# Patient Record
Sex: Male | Born: 1985 | Race: White | Hispanic: No | Marital: Single | Smoking: Former smoker
Health system: Southern US, Community
[De-identification: ages and names within clinical notes are randomized; demographics above are authoritative.]

## PROBLEM LIST (undated history)

## (undated) DIAGNOSIS — M419 Scoliosis, unspecified: Secondary | ICD-10-CM

## (undated) DIAGNOSIS — F419 Anxiety disorder, unspecified: Secondary | ICD-10-CM

## (undated) DIAGNOSIS — R12 Heartburn: Secondary | ICD-10-CM

## (undated) HISTORY — DX: Heartburn: R12

## (undated) HISTORY — DX: Anxiety disorder, unspecified: F41.9

## (undated) HISTORY — DX: Scoliosis, unspecified: M41.9

---

## 2006-01-07 ENCOUNTER — Ambulatory Visit: Payer: Self-pay | Admitting: Family Medicine

## 2007-06-22 ENCOUNTER — Encounter: Payer: Self-pay | Admitting: Endocrinology

## 2007-06-22 ENCOUNTER — Ambulatory Visit: Payer: Self-pay | Admitting: Endocrinology

## 2007-06-22 LAB — CONVERTED CEMR LAB
FSH: 9 milliintl units/mL
LH: 2.8 milliintl units/mL

## 2008-12-24 ENCOUNTER — Emergency Department (HOSPITAL_COMMUNITY): Admission: EM | Admit: 2008-12-24 | Discharge: 2008-12-24 | Payer: Self-pay | Admitting: Emergency Medicine

## 2011-01-17 LAB — RAPID URINE DRUG SCREEN, HOSP PERFORMED
Barbiturates: NOT DETECTED
Benzodiazepines: NOT DETECTED
Cocaine: NOT DETECTED
Opiates: NOT DETECTED

## 2011-01-17 LAB — URINALYSIS, ROUTINE W REFLEX MICROSCOPIC
Glucose, UA: NEGATIVE mg/dL
Nitrite: NEGATIVE
Specific Gravity, Urine: 1.005 (ref 1.005–1.030)
pH: 6 (ref 5.0–8.0)

## 2011-01-17 LAB — COMPREHENSIVE METABOLIC PANEL
AST: 22 U/L (ref 0–37)
BUN: 10 mg/dL (ref 6–23)
CO2: 23 mEq/L (ref 19–32)
Calcium: 8.7 mg/dL (ref 8.4–10.5)
Creatinine, Ser: 0.95 mg/dL (ref 0.4–1.5)
GFR calc Af Amer: >60 mL/min (ref >60)
GFR calc non Af Amer: >60 mL/min (ref >60)
Total Bilirubin: 1.2 mg/dL (ref 0.3–1.2)

## 2011-01-17 LAB — DIFFERENTIAL
Basophils Absolute: 0 10*3/uL (ref 0.0–0.1)
Eosinophils Relative: 1 % (ref 0–5)
Lymphocytes Relative: 23 % (ref 12–46)
Lymphs Abs: 2.2 10*3/uL (ref 0.7–4.0)
Neutro Abs: 6.8 10*3/uL (ref 1.7–7.7)
Neutrophils Relative %: 69 % (ref 43–77)

## 2011-01-17 LAB — CBC
HCT: 44.1 % (ref 39.0–52.0)
MCHC: 34.2 g/dL (ref 30.0–36.0)
MCV: 93.5 fL (ref 78.0–100.0)
RBC: 4.71 MIL/uL (ref 4.22–5.81)

## 2017-03-05 ENCOUNTER — Telehealth: Payer: Self-pay | Admitting: Family Medicine

## 2017-03-05 NOTE — Telephone Encounter (Signed)
Patient would like to reestablish care with you.  He was last seen 05/12/2013.  He has [employer] for insurance.  He has not seen another PCP.  He just has not needed to be seen.  He needs to be seen for cough that has been happening for a couple of months.  Mostly in the morning for about an hour.  He is also having trouble eating.  Says he wakes up hungry but when he tries to eat he just can't.  Says he doesn't really have trouble swallow.  He is not on any medications.  If you are not available to see him he is willing to see another provider.

## 2017-03-05 NOTE — Telephone Encounter (Signed)
That's fine

## 2017-03-06 NOTE — Telephone Encounter (Signed)
Appointment scheduled for 03/10/17@4 /MW

## 2017-03-10 ENCOUNTER — Ambulatory Visit (INDEPENDENT_AMBULATORY_CARE_PROVIDER_SITE_OTHER): Payer: BLUE CROSS/BLUE SHIELD | Admitting: Family Medicine

## 2017-03-10 ENCOUNTER — Encounter: Payer: Self-pay | Admitting: Family Medicine

## 2017-03-10 VITALS — BP 100/73 | HR 73 | Temp 98.1°F | Resp 16 | Ht 64.0 in | Wt 129.0 lb

## 2017-03-10 DIAGNOSIS — R6881 Early satiety: Secondary | ICD-10-CM

## 2017-03-10 NOTE — Progress Notes (Signed)
       Patient: Eddie Olson Male    DOB: [birth year]   31 y.o.   MRN: [id number] Visit Date: 03/10/2017  Today's Provider: Mila Merryonald Laparis Durrett, MD   Chief Complaint  Patient presents with  . Establish Care   Subjective:    HPI Patient is here to reestabish care. He was last seen in 2014. At that time was on sertraline for SAD which he was able to stop a few years ago. He is here today for evaluation of early satiety. He states he has always had poor appetite and he usually feels full after just a few bites of a meal. Is not nauseated and has no stomach pains, but just fills up very quickly No change in bowel movements. No recent weight loss.  No Known Allergies  No current outpatient prescriptions on file.  Review of Systems  Constitutional: Positive for appetite change.  HENT: Positive for mouth sores.   Eyes: Negative.   Respiratory: Positive for cough and shortness of breath.   Cardiovascular: Negative.   Gastrointestinal: Negative.   Endocrine: Negative.   Genitourinary: Negative.   Musculoskeletal: Negative.   Skin: Negative.   Allergic/Immunologic: Negative.   Neurological: Negative.   Hematological: Negative.   Psychiatric/Behavioral: Negative.     Social History  Substance Use Topics  . Smoking status: Light Tobacco Smoker    Years: 5.00  . Smokeless tobacco: Never Used     Comment: Hookah   . Alcohol use No   Objective:   BP 100/73 (BP Location: Right Arm, Patient Position: Sitting, Cuff Size: Normal)   Pulse 73   Temp 98.1 F (36.7 C) (Oral)   Resp 16   Ht 5\' 4"  (1.626 m)   Wt 129 lb (58.5 kg)   SpO2 98%   BMI 22.14 kg/m  Vitals:   03/10/17 1617  BP: 100/73  Pulse: 73  Resp: 16  Temp: 98.1 F (36.7 C)  TempSrc: Oral  SpO2: 98%  Weight: 129 lb (58.5 kg)  Height: 5\' 4"  (1.626 m)   Depression screen [facility] 03/10/2017  Decreased Interest 0  Down, Depressed, Hopeless 1  PHQ - 2 Score 1  Altered sleeping 0  Tired, decreased energy 1  Change in  appetite 3  Feeling bad or failure about yourself  2  Trouble concentrating 3  Moving slowly or fidgety/restless 0  Suicidal thoughts 0  PHQ-9 Score 10  Difficult doing work/chores Not difficult at all      Physical Exam  General Appearance:    Alert, cooperative, no distress  Eyes:    PERRL, conjunctiva/corneas clear, EOM's intact       Lungs:     Clear to auscultation bilaterally, respirations unlabored  Heart:    Regular rate and rhythm  Abdomen:   . No CVA tenderness. Soft non-tender, non-distended. No masses. No bruits.         Assessment & Plan:     1. Early satiety  - Comprehensive metabolic panel - CBC - Lipid panel - T4 AND TSH  Is clearly not malnourished. Advised if labs are normal we can consider upper GI to rule out structural stomach abnormality.       Mila Merryonald Dasean Brow, MD  [facility]

## 2017-03-11 ENCOUNTER — Telehealth: Payer: Self-pay

## 2017-03-11 DIAGNOSIS — R6881 Early satiety: Secondary | ICD-10-CM

## 2017-03-11 LAB — CBC
HEMATOCRIT: 42.9 % (ref 37.5–51.0)
HEMOGLOBIN: 15.1 g/dL (ref 13.0–17.7)
MCH: 31.5 pg (ref 26.6–33.0)
MCHC: 35.2 g/dL (ref 31.5–35.7)
MCV: 89 fL (ref 79–97)
Platelets: 216 10*3/uL (ref 150–379)
RBC: 4.8 x10E6/uL (ref 4.14–5.80)
RDW: 12.6 % (ref 12.3–15.4)
WBC: 8.5 10*3/uL (ref 3.4–10.8)

## 2017-03-11 LAB — LIPID PANEL
CHOL/HDL RATIO: 2.5 ratio (ref 0.0–5.0)
Cholesterol, Total: 132 mg/dL (ref 100–199)
HDL: 53 mg/dL (ref >39)
LDL CALC: 58 mg/dL (ref 0–99)
Triglycerides: 105 mg/dL (ref 0–149)
VLDL Cholesterol Cal: 21 mg/dL (ref 5–40)

## 2017-03-11 LAB — COMPREHENSIVE METABOLIC PANEL
ALBUMIN: 4.7 g/dL (ref 3.5–5.5)
ALT: 15 IU/L (ref 0–44)
AST: 14 IU/L (ref 0–40)
Albumin/Globulin Ratio: 2 (ref 1.2–2.2)
Alkaline Phosphatase: 67 IU/L (ref 39–117)
BILIRUBIN TOTAL: 0.5 mg/dL (ref 0.0–1.2)
BUN / CREAT RATIO: 8 — AB (ref 9–20)
BUN: 9 mg/dL (ref 6–20)
CALCIUM: 9.8 mg/dL (ref 8.7–10.2)
CHLORIDE: 105 mmol/L (ref 96–106)
CO2: 24 mmol/L (ref 18–29)
CREATININE: 1.11 mg/dL (ref 0.76–1.27)
GFR, EST AFRICAN AMERICAN: 103 mL/min/{1.73_m2} (ref >59)
GFR, EST NON AFRICAN AMERICAN: 89 mL/min/{1.73_m2} (ref >59)
GLUCOSE: 83 mg/dL (ref 65–99)
Globulin, Total: 2.3 g/dL (ref 1.5–4.5)
Potassium: 4.6 mmol/L (ref 3.5–5.2)
Sodium: 143 mmol/L (ref 134–144)
TOTAL PROTEIN: 7 g/dL (ref 6.0–8.5)

## 2017-03-11 LAB — T4 AND TSH
T4, Total: 7.4 ug/dL (ref 4.5–12.0)
TSH: 1.92 u[IU]/mL (ref 0.450–4.500)

## 2017-03-11 NOTE — Telephone Encounter (Signed)
-----   Message from Malva Limesonald E Fisher, MD sent at 03/11/2017  7:45 AM EDT ----- Labs normal. Recommend upper GI with small bowel follow through for early satiety.

## 2017-03-11 NOTE — Telephone Encounter (Signed)
Pt informed. He is agreeable to the test. Are you referring him to GI or ordering the test?

## 2017-03-11 NOTE — Telephone Encounter (Signed)
Left message to call back  

## 2017-03-12 DIAGNOSIS — R6881 Early satiety: Secondary | ICD-10-CM | POA: Insufficient documentation

## 2017-03-21 ENCOUNTER — Other Ambulatory Visit: Payer: Self-pay

## 2017-03-25 ENCOUNTER — Ambulatory Visit
Admission: RE | Admit: 2017-03-25 | Discharge: 2017-03-25 | Disposition: A | Discharge status: Home / Self Care | Payer: BLUE CROSS/BLUE SHIELD | Source: Ambulatory Visit | Attending: Family Medicine | Admitting: Family Medicine

## 2017-03-25 ENCOUNTER — Telehealth: Payer: Self-pay

## 2017-03-25 DIAGNOSIS — R6881 Early satiety: Secondary | ICD-10-CM | POA: Diagnosis present

## 2017-03-25 DIAGNOSIS — K219 Gastro-esophageal reflux disease without esophagitis: Secondary | ICD-10-CM | POA: Insufficient documentation

## 2017-03-25 DIAGNOSIS — K449 Diaphragmatic hernia without obstruction or gangrene: Secondary | ICD-10-CM | POA: Insufficient documentation

## 2017-03-25 NOTE — Telephone Encounter (Signed)
-----   Message from Malva Limesonald E Fisher, MD sent at 03/25/2017 12:51 PM EDT ----- Upper gi shows severe acid reflux which may be causing his gi symptoms. Need to start pantoprazole 40mg  once a day, #30, rf x 1 and schedule follow up in a month. Need to eat small frequent meals (try 5 small meals a day)

## 2017-03-25 NOTE — Telephone Encounter (Signed)
Left message to call back  

## 2017-03-26 MED ORDER — PANTOPRAZOLE SODIUM 40 MG PO TBEC: 40.0000 mg | DELAYED_RELEASE_TABLET | Freq: Every day | ORAL | 1 refills | Status: DC

## 2017-03-26 NOTE — Telephone Encounter (Signed)
Pt returned called

## 2017-03-26 NOTE — Telephone Encounter (Signed)
Patient was notified of results. Expressed understanding. Rx sent to pharmacy. 

## 2017-04-21 ENCOUNTER — Other Ambulatory Visit: Payer: Self-pay | Admitting: Family Medicine

## 2017-04-21 MED ORDER — PANTOPRAZOLE SODIUM 40 MG PO TBEC: 40.0000 mg | DELAYED_RELEASE_TABLET | Freq: Every day | ORAL | 4 refills | Status: DC

## 2017-04-21 NOTE — Telephone Encounter (Signed)
[facility] pharmacy faxed a request for a 90-days supply on the following medication. Thanks CC  pantoprazole (PROTONIX) 40 MG tablet  >Take 1 tablet by mouth every day.

## 2017-04-23 ENCOUNTER — Ambulatory Visit (INDEPENDENT_AMBULATORY_CARE_PROVIDER_SITE_OTHER): Payer: BLUE CROSS/BLUE SHIELD | Admitting: Family Medicine

## 2017-04-23 ENCOUNTER — Encounter: Payer: Self-pay | Admitting: Family Medicine

## 2017-04-23 DIAGNOSIS — K449 Diaphragmatic hernia without obstruction or gangrene: Secondary | ICD-10-CM | POA: Diagnosis not present

## 2017-04-23 DIAGNOSIS — K219 Gastro-esophageal reflux disease without esophagitis: Secondary | ICD-10-CM | POA: Insufficient documentation

## 2017-04-23 NOTE — Patient Instructions (Signed)
Hiatal Hernia  A hiatal hernia occurs when part of the stomach slides above the muscle that separates the abdomen from the chest (diaphragm). A person can be born with a hiatal hernia (congenital), or it may develop over time. In almost all cases of hiatal hernia, only the top part of the stomach pushes through the diaphragm.  Many people have a hiatal hernia with no symptoms. The larger the hernia, the more likely it is that you will have symptoms. In some cases, a hiatal hernia allows stomach acid to flow back into the tube that carries food from your mouth to your stomach (esophagus). This may cause heartburn symptoms. Severe heartburn symptoms may mean that you have developed a condition called gastroesophageal reflux disease (GERD).  What are the causes?  This condition is caused by a weakness in the opening (hiatus) where the esophagus passes through the diaphragm to attach to the upper part of the stomach. A person may be born with a weakness in the hiatus, or a weakness can develop over time.  What increases the risk?  This condition is more likely to develop in:  · Older people. Age is a major risk factor for a hiatal hernia, especially if you are over the age of 50.  · Pregnant women.  · People who are overweight.  · People who have frequent constipation.    What are the signs or symptoms?  Symptoms of this condition usually develop in the form of GERD symptoms. Symptoms include:  · Heartburn.  · Belching.  · Indigestion.  · Trouble swallowing.  · Coughing or wheezing.  · Sore throat.  · Hoarseness.  · Chest pain.  · Nausea and vomiting.    How is this diagnosed?  This condition may be diagnosed during testing for GERD. Tests that may be done include:  · X-rays of your stomach or chest.  · An upper gastrointestinal (GI) series. This is an X-ray exam of your GI tract that is taken after you swallow a chalky liquid that shows up clearly on the X-ray.  · Endoscopy. This is a procedure to look into your  stomach using a thin, flexible tube that has a tiny camera and light on the end of it.    How is this treated?  This condition may be treated by:  · Dietary and lifestyle changes to help reduce GERD symptoms.  · Medicines. These may include:  ? Over-the-counter antacids.  ? Medicines that make your stomach empty more quickly.  ? Medicines that block the production of stomach acid (H2 blockers).  ? Stronger medicines to reduce stomach acid (proton pump inhibitors).  · Surgery to repair the hernia, if other treatments are not helping.    If you have no symptoms, you may not need treatment.  Follow these instructions at home:  Lifestyle and activity  · Do not use any products that contain nicotine or tobacco, such as cigarettes and e-cigarettes. If you need help quitting, ask your health care provider.  · Try to achieve and maintain a healthy body weight.  · Avoid putting pressure on your abdomen. Anything that puts pressure on your abdomen increases the amount of acid that may be pushed up into your esophagus.  ? Avoid bending over, especially after eating.  ? Raise the head of your bed by putting blocks under the legs. This keeps your head and esophagus higher than your stomach.  ? Do not wear tight clothing around your chest or stomach.  ?   Try not to strain when having a bowel movement, when urinating, or when lifting heavy objects.  Eating and drinking  · Avoid foods that can worsen GERD symptoms. These may include:  ? Fatty foods, like fried foods.  ? Citrus fruits, like oranges or lemon.  ? Other foods and drinks that contain acid, like orange juice or tomatoes.  ? Spicy food.  ? Chocolate.  · Eat frequent small meals instead of three large meals a day. This helps prevent your stomach from getting too full.  ? Eat slowly.  ? Do not lie down right after eating.  ? Do not eat 1-2 hours before bed.  · Do not drink beverages with caffeine. These include cola, coffee, cocoa, and tea.  · Do not drink alcohol.  General  instructions  · Take over-the-counter and prescription medicines only as told by your health care provider.  · Keep all follow-up visits as told by your health care provider. This is important.  Contact a health care provider if:  · Your symptoms are not controlled with medicines or lifestyle changes.  · You are having trouble swallowing.  · You have coughing or wheezing that will not go away.  Get help right away if:  · Your pain is getting worse.  · Your pain spreads to your arms, neck, jaw, teeth, or back.  · You have shortness of breath.  · You sweat for no reason.  · You feel sick to your stomach (nauseous) or you vomit.  · You vomit blood.  · You have bright red blood in your stools.  · You have black, tarry stools.  This information is not intended to replace advice given to you by your health care provider. Make sure you discuss any questions you have with your health care provider.  Document Released: 12/14/2003 Document Revised: 09/16/2016 Document Reviewed: 09/16/2016  Elsevier Interactive Patient Education © 2018 Elsevier Inc.

## 2017-04-23 NOTE — Progress Notes (Signed)
       Patient: Eddie Olson Male    DOB: 1986/01/22   31 y.o.   MRN: [id number] Visit Date: 04/23/2017  Today's Provider: Mila Merryonald Fisher, MD   Chief Complaint  Patient presents with  . Follow-up  . Gastroesophageal Reflux   Subjective:    HPI  Acid Reflux From 03/10/2017-upper GI ordered, showing severe gastroesophageal reflux. Started pantoprazole 40 mg qd. Patient counseled to eat small frequent meals daily.   Patient states that since starting pantoprazole acid reflux has almost stopped. However patient says he is still struggling with appetite.   Wt Readings from Last 3 Encounters:  04/23/17 126 lb (57.2 kg)  03/10/17 129 lb (58.5 kg)  06/22/07 102 lb (46.3 kg) (<1 %, Z= -3.19)*   * Growth percentiles are based on CDC 2-20 Years data.     No Known Allergies   Current Outpatient Prescriptions:  .  pantoprazole (PROTONIX) 40 MG tablet, Take 1 tablet (40 mg total) by mouth daily., Disp: 90 tablet, Rfl: 4  Review of Systems  Constitutional: Negative for appetite change, chills and fever.  Respiratory: Negative for chest tightness, shortness of breath and wheezing.   Cardiovascular: Negative for chest pain and palpitations.  Gastrointestinal: Negative for abdominal pain, nausea and vomiting.    Social History  Substance Use Topics  . Smoking status: Light Tobacco Smoker    Years: 5.00  . Smokeless tobacco: Never Used     Comment: Hookah   . Alcohol use No   Objective:   BP 110/80 (BP Location: Right Arm, Patient Position: Sitting, Cuff Size: Normal)   Pulse (!) 58   Temp 98 F (36.7 C) (Oral)   Resp 16   Wt 126 lb (57.2 kg)   SpO2 98%   BMI 21.63 kg/m  Vitals:   04/23/17 1608  BP: 110/80  Pulse: (!) 58  Resp: 16  Temp: 98 F (36.7 C)  TempSrc: Oral  SpO2: 98%  Weight: 126 lb (57.2 kg)     Physical Exam  General appearance: alert, well developed, well nourished, cooperative and in no distress Head: Normocephalic, without obvious  abnormality, atraumatic Respiratory: Respirations even and unlabored, normal respiratory rate Extremities: No gross deformities Skin: Skin color, texture, turgor normal. No rashes seen  Psych: Appropriate mood and affect. Neurologic: Mental status: Alert, oriented to person, place, and time, thought content appropriate.     Assessment & Plan:     1. Gastroesophageal reflux disease, esophagitis presence not specified Much better since starting pantoprazole. Continue current medications.    2. Hiatal hernia Dietary counseling today. Call if sx change or worsen.        Mila Merryonald Fisher, MD  [facility]

## 2018-03-24 IMAGING — RF DG UGI W/ SMALL BOWEL
15 of 22 series · 15 of 22 positions shown · non-contrast
Comparison: No prior.

CLINICAL DATA: Early satiety.
TECHNIQUE: Combined double contrast and single contrast upper GI series using
effervescent crystals, thick barium, and thin barium. Subsequently,
serial images of the small bowel were obtained including spot views
of the terminal ileum.

[Series 1: t abdomen supine · 0.15mm/px · 1 of 1 slices shown]
[im 1/1]
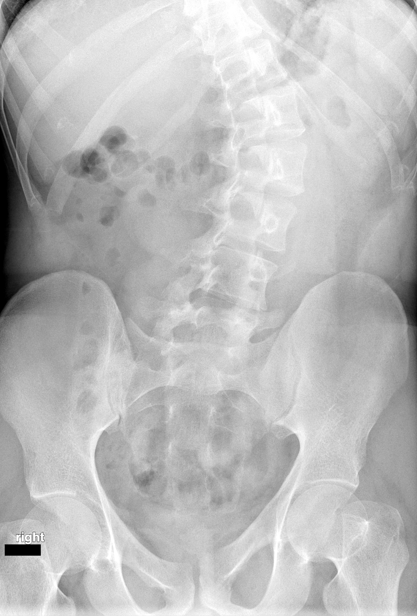

[Series 2: fluoro_barium 2fps_bw · 0.18mm/px · 1 of 1 slices shown (1 of 3)]
[im 1/1]
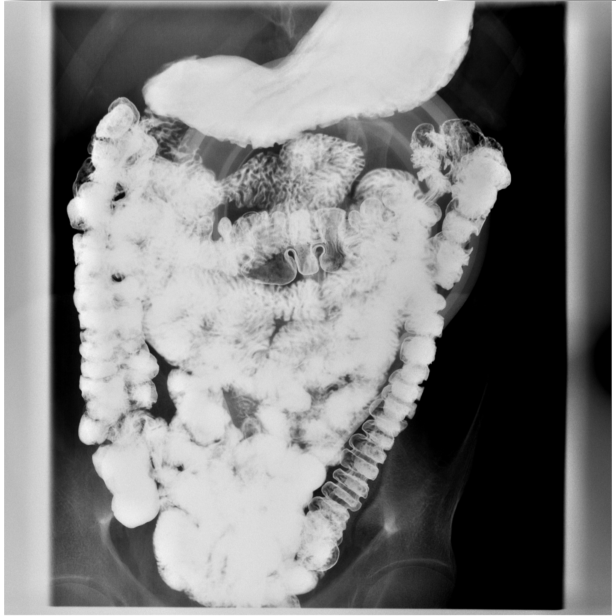

[Series 2: fluoro_barium swallow 2fps_bw · 0.19mm/px · 1 of 1 slices shown (1 of 10)]
[im 1/1]
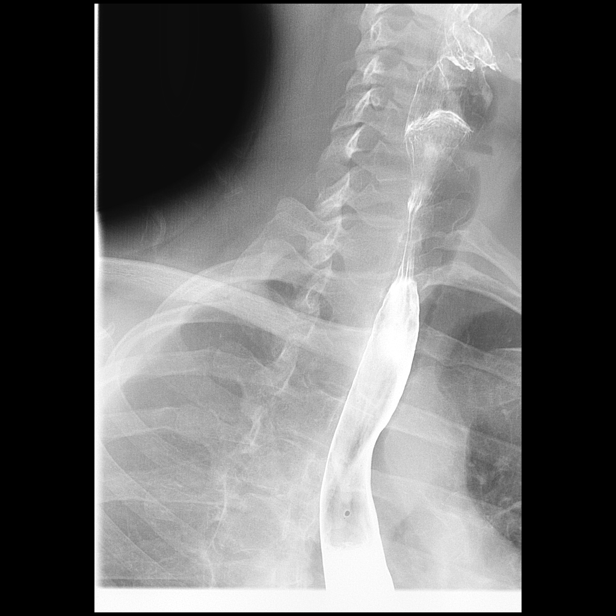

[Series 3: fluoro_barium swallow 2fps_bw · 0.19mm/px · 1 of 1 slices shown (2 of 10)]
[im 1/1]
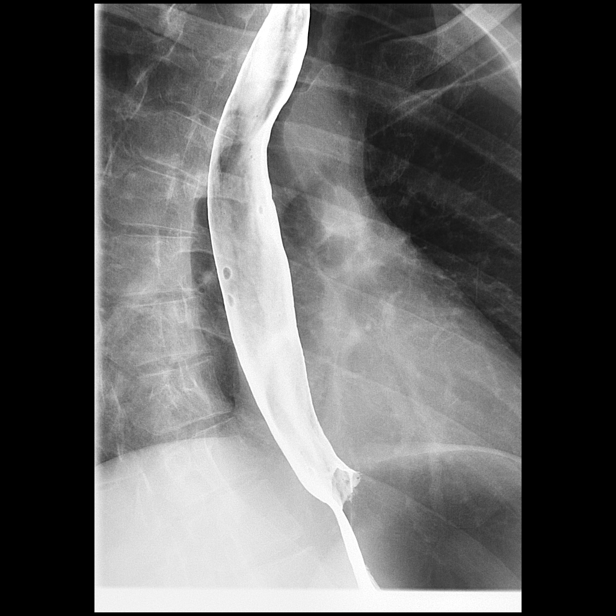

[Series 4: fluoro_barium 2fps_bw · 0.18mm/px · 1 of 1 slices shown (2 of 3)]
[im 1/1]
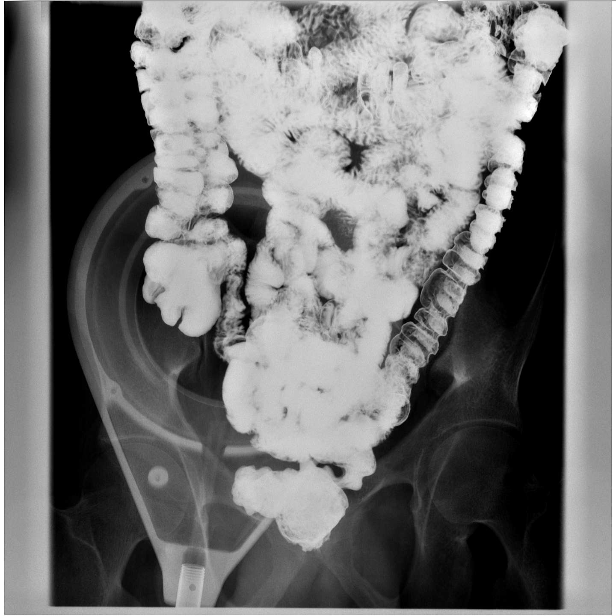

[Series 5: fluoro_barium 2fps_bw · 0.18mm/px · 1 of 1 slices shown (3 of 3)]
[im 1/1]
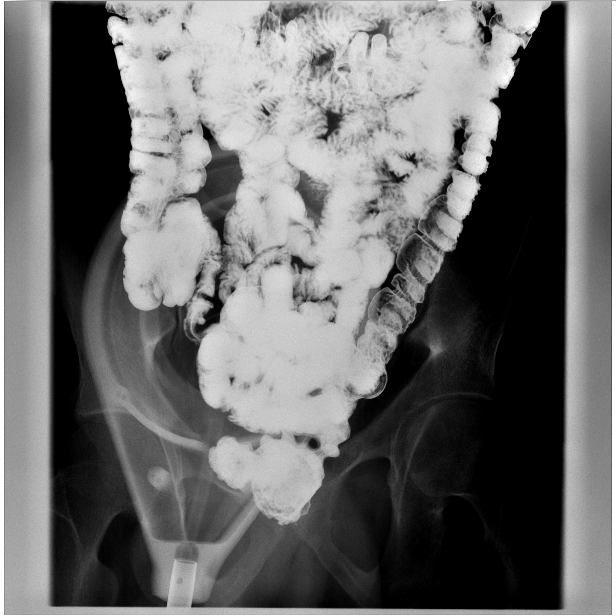

[Series 5: fluoro_barium swallow 2fps_bw · 0.19mm/px · 1 of 1 slices shown (3 of 10)]
[im 1/1]
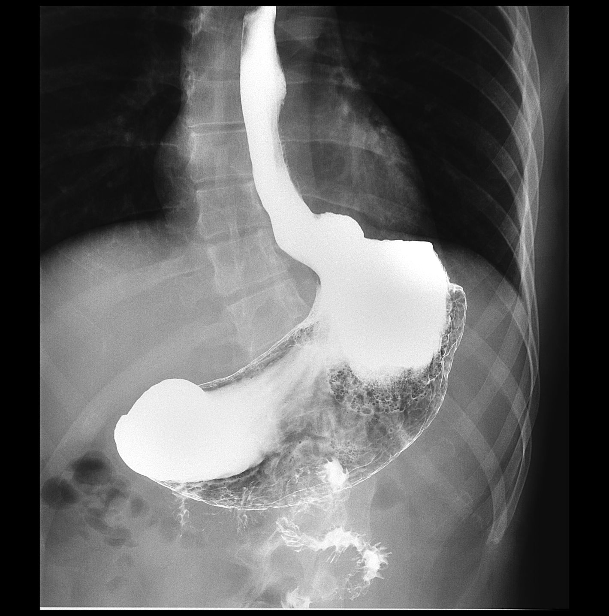

[Series 7: fluoro_barium swallow 2fps_bw · 0.19mm/px · 1 of 1 slices shown (4 of 10)]
[im 1/1]
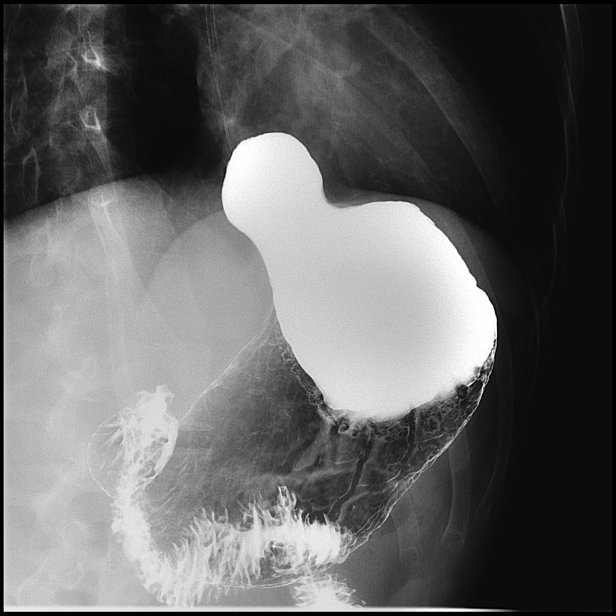

[Series 8: fluoro_barium swallow 2fps_bw · 0.20mm/px · 1 of 1 slices shown (5 of 10)]
[im 1/1]
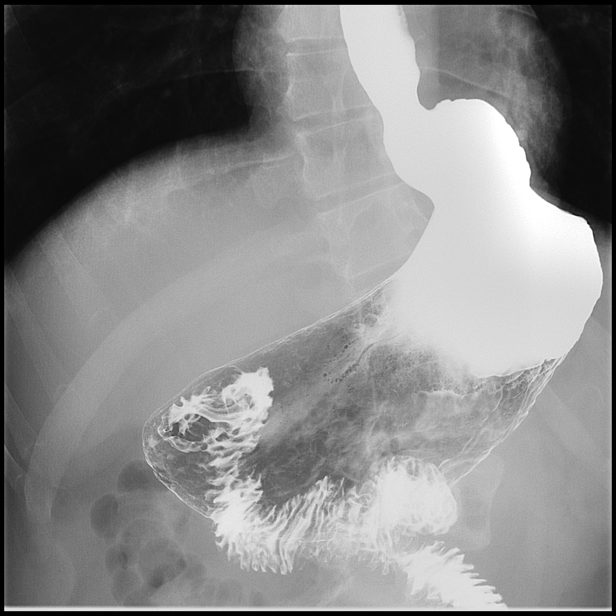

[Series 9: fluoro_barium swallow 2fps_bw · 0.20mm/px · 1 of 1 slices shown (6 of 10)]
[im 1/1]
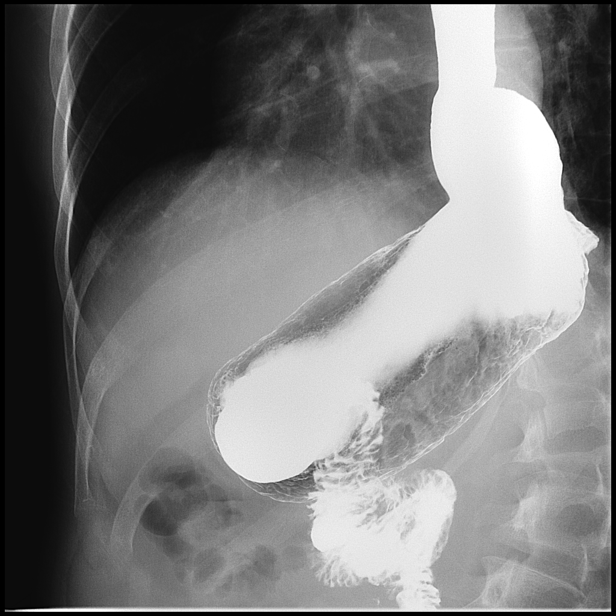

[Series 11: fluoro_barium swallow 2fps_bw · 0.20mm/px · 1 of 1 slices shown (7 of 10)]
[im 1/1]
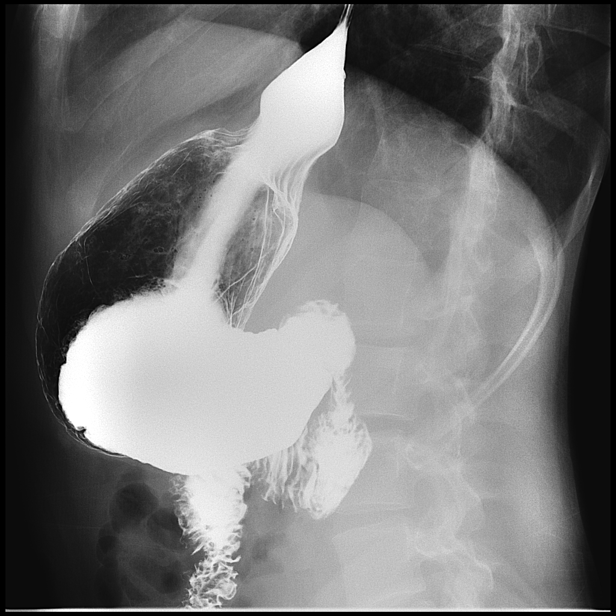

[Series 12: fluoro_barium swallow 2fps_bw · 0.20mm/px · 1 of 1 slices shown (8 of 10)]
[im 1/1]
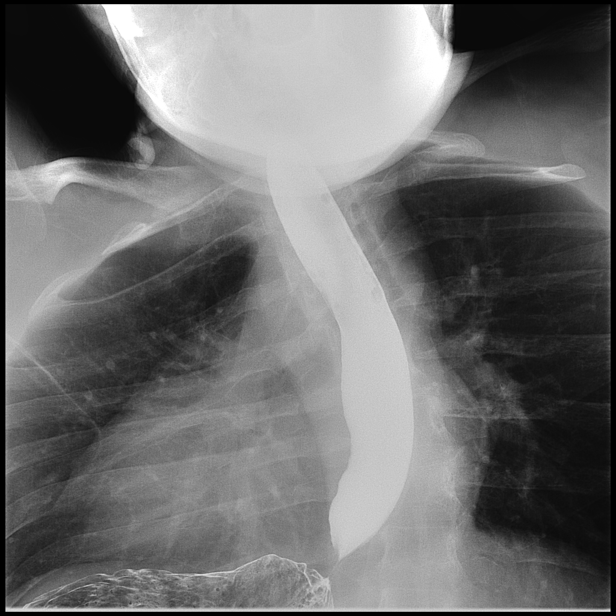

[Series 14: fluoro_barium swallow 2fps_bw · 0.22mm/px · 1 of 1 slices shown (9 of 10)]
[im 1/1]
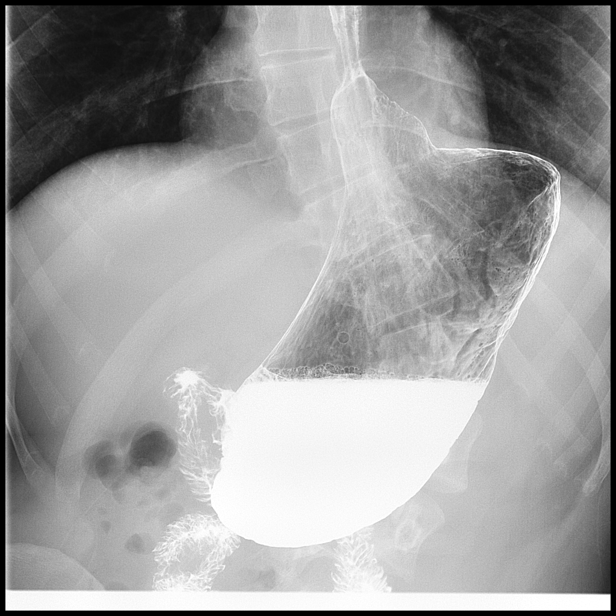

[Series 15: fluoro_barium swallow 2fps_bw · 0.22mm/px · 1 of 1 slices shown (10 of 10)]
[im 1/1]
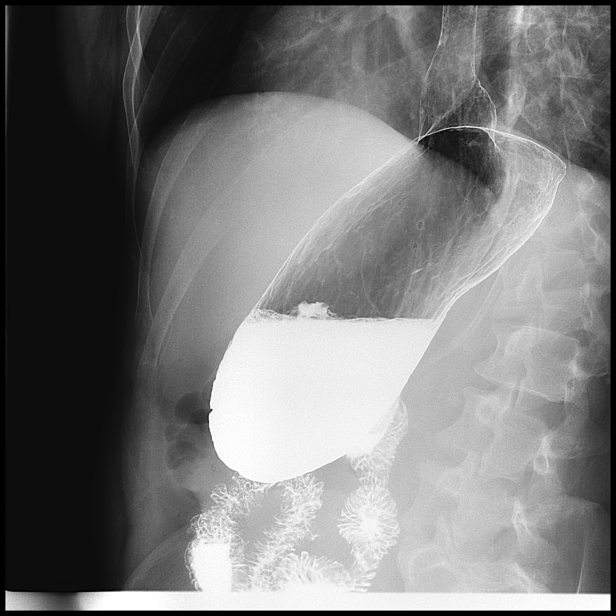

[Series 17: t abdomen barium ap · 0.15mm/px · 1 of 1 slices shown]
[im 1/1]
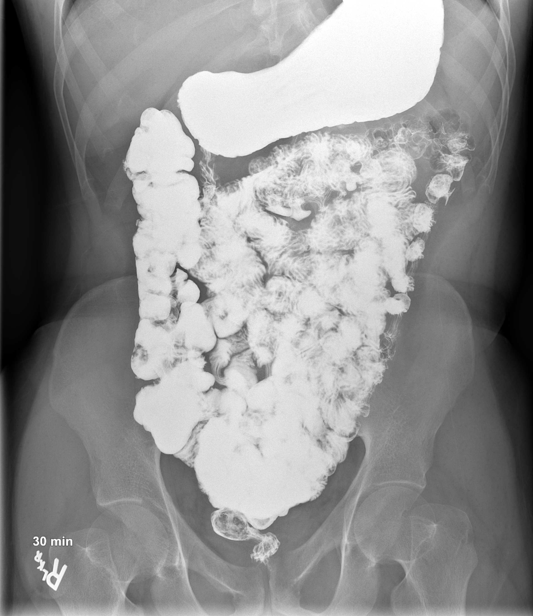

[15 of 22 positions shown; findings below may reference images not displayed]

EXAM:
UPPER GI SERIES WITH SMALL BOWEL FOLLOW-THROUGH

FLUOROSCOPY TIME:  Fluoroscopy Time: 18 seconds for small-bowel
follow-through. Data for the upper GI portion exam was not recorded.

Radiation Exposure Index (if provided by the fluoroscopic device):
12 mGy for small-bowel follow-through.

Number of Acquired Spot Images: 17
FINDINGS: Esophagus is widely patent. Small sliding hiatal hernia. Severe
gastroesophageal reflux. Stomach and duodenum are normal. Small
bowel is normal. No small bowel fold thickening or obstructing
abnormality identified. Terminal ileum is normal. Transit time
normal.
IMPRESSION: 1. Small sliding hiatal hernia with severe gastroesophageal reflux.

2. Exam is otherwise unremarkable. Small-bowel follow-through is
normal.

## 2018-03-29 ENCOUNTER — Emergency Department: Payer: BLUE CROSS/BLUE SHIELD

## 2018-03-29 ENCOUNTER — Emergency Department
Admission: EM | Admit: 2018-03-29 | Discharge: 2018-03-29 | Disposition: A | Discharge status: Home / Self Care | Payer: BLUE CROSS/BLUE SHIELD | Attending: Student in an Organized Health Care Education/Training Program | Admitting: Student in an Organized Health Care Education/Training Program

## 2018-03-29 DIAGNOSIS — F17200 Nicotine dependence, unspecified, uncomplicated: Secondary | ICD-10-CM | POA: Diagnosis not present

## 2018-03-29 DIAGNOSIS — R1031 Right lower quadrant pain: Secondary | ICD-10-CM | POA: Insufficient documentation

## 2018-03-29 DIAGNOSIS — R110 Nausea: Secondary | ICD-10-CM | POA: Diagnosis not present

## 2018-03-29 DIAGNOSIS — Z79899 Other long term (current) drug therapy: Secondary | ICD-10-CM | POA: Insufficient documentation

## 2018-03-29 LAB — COMPREHENSIVE METABOLIC PANEL
ALK PHOS: 61 U/L (ref 38–126)
ALT: 19 U/L (ref 17–63)
AST: 23 U/L (ref 15–41)
Albumin: 4.5 g/dL (ref 3.5–5.0)
Anion gap: 10 (ref 5–15)
BILIRUBIN TOTAL: 1.5 mg/dL — AB (ref 0.3–1.2)
BUN: 13 mg/dL (ref 6–20)
CALCIUM: 9.5 mg/dL (ref 8.9–10.3)
CHLORIDE: 108 mmol/L (ref 101–111)
CO2: 20 mmol/L — ABNORMAL LOW (ref 22–32)
CREATININE: 1 mg/dL (ref 0.61–1.24)
Glucose, Bld: 111 mg/dL — ABNORMAL HIGH (ref 65–99)
Potassium: 3.3 mmol/L — ABNORMAL LOW (ref 3.5–5.1)
Sodium: 138 mmol/L (ref 135–145)
TOTAL PROTEIN: 7.5 g/dL (ref 6.5–8.1)

## 2018-03-29 LAB — URINALYSIS, COMPLETE (UACMP) WITH MICROSCOPIC
Bacteria, UA: NONE SEEN
Bilirubin Urine: NEGATIVE
Glucose, UA: NEGATIVE mg/dL
Ketones, ur: 5 mg/dL — AB
LEUKOCYTES UA: NEGATIVE
Nitrite: NEGATIVE
PH: 6 (ref 5.0–8.0)
Protein, ur: NEGATIVE mg/dL
SPECIFIC GRAVITY, URINE: 1.043 — AB (ref 1.005–1.030)
SQUAMOUS EPITHELIAL / LPF: NONE SEEN (ref 0–5)

## 2018-03-29 LAB — CBC
HCT: 45.1 % (ref 40.0–52.0)
Hemoglobin: 16.1 g/dL (ref 13.0–18.0)
MCH: 31.8 pg (ref 26.0–34.0)
MCHC: 35.7 g/dL (ref 32.0–36.0)
MCV: 89.2 fL (ref 80.0–100.0)
PLATELETS: 197 10*3/uL (ref 150–440)
RBC: 5.06 MIL/uL (ref 4.40–5.90)
RDW: 13.3 % (ref 11.5–14.5)
WBC: 13.2 10*3/uL — AB (ref 3.8–10.6)

## 2018-03-29 LAB — LIPASE, BLOOD: LIPASE: 31 U/L (ref 11–51)

## 2018-03-29 MED ORDER — IOPAMIDOL (ISOVUE-300) INJECTION 61%
100.0000 mL | Freq: Once | INTRAVENOUS | Status: AC | PRN
  Administered 2018-03-29: 100 mL via INTRAVENOUS

## 2018-03-29 MED ORDER — SODIUM CHLORIDE 0.9 % IV BOLUS
1000.0000 mL | Freq: Once | INTRAVENOUS | Status: AC
  Administered 2018-03-29: 1000 mL via INTRAVENOUS

## 2018-03-29 MED ORDER — PROMETHAZINE HCL 25 MG/ML IJ SOLN: 12.5000 mg | Freq: Four times a day (QID) | INTRAMUSCULAR | Status: DC | PRN

## 2018-03-29 MED ORDER — MORPHINE SULFATE (PF) 4 MG/ML IV SOLN: 4.0000 mg | INTRAVENOUS | Status: DC | PRN

## 2018-03-29 NOTE — Discharge Instructions (Addendum)

## 2018-03-29 NOTE — ED Notes (Signed)
First Nurse Note: Pt to ED c/o abdominal pain. Pt in NAD

## 2018-03-29 NOTE — ED Provider Notes (Signed)
[facility] Emergency Department Provider Note    First MD Initiated Contact with Patient 03/29/18 1622     (approximate)  I have reviewed the triage vital signs and the nursing notes.   HISTORY  Chief Complaint Abdominal Pain    HPI Eddie Olson is a 32 y.o. male history of heartburn, scoliosis anxiety presents to the ER with chief complaint of right lower quadrant pain that started early this morning lasting roughly 4 to 5 hours.  States he was mild to moderate in severity associated with some nausea but no vomiting.  No measured fevers.  He is never had pain like this before.  States it became acutely severe and then released.  Denies any recent illness.  States he was otherwise feeling well yesterday.  Currently feels improved but still has some discomfort in the right lower quadrant.  No pain radiating into his groin.  Denies any dysuria.    Past Medical History:  Diagnosis Date  . Anxiety   . Heartburn   . Scoliosis    Family History  Problem Relation Age of Onset  . Cancer Maternal Uncle 44       Brain  . Diabetes Maternal Grandmother    History reviewed. No pertinent surgical history. Patient Active Problem List   Diagnosis Date Noted  . GERD (gastroesophageal reflux disease) 04/23/2017  . Hiatal hernia 04/23/2017  . Early satiety 03/12/2017      Prior to Admission medications   Medication Sig Start Date End Date Taking? Authorizing Provider  pantoprazole (PROTONIX) 40 MG tablet Take 1 tablet (40 mg total) by mouth daily. 04/21/17  Yes Malva Limes, MD    Allergies Patient has no known allergies.    Social History Social History   Tobacco Use  . Smoking status: Light Tobacco Smoker    Years: 5.00  . Smokeless tobacco: Never Used  . Tobacco comment: Hookah   Substance Use Topics  . Alcohol use: No  . Drug use: Yes    Types: Marijuana    Review of Systems Patient denies headaches, rhinorrhea, blurry vision,  numbness, shortness of breath, chest pain, edema, cough, abdominal pain, nausea, vomiting, diarrhea, dysuria, fevers, rashes or hallucinations unless otherwise stated above in HPI. ____________________________________________   PHYSICAL EXAM:  VITAL SIGNS: Vitals:   03/29/18 1619 03/29/18 1630  BP: 128/85 117/83  Pulse: 66 82  Resp: 14 15  Temp: 98.5 F (36.9 C)   SpO2: 100% 97%    Constitutional: Alert and oriented.  Eyes: Conjunctivae are normal.  Head: Atraumatic. Nose: No congestion/rhinnorhea. Mouth/Throat: Mucous membranes are moist.   Neck: No stridor. Painless ROM.  Cardiovascular: Normal rate, regular rhythm. Grossly normal heart sounds.  Good peripheral circulation. Respiratory: Normal respiratory effort.  No retractions. Lungs CTAB. Gastrointestinal: Soft with ttp of RLQ. No distention. No abdominal bruits. No CVA tenderness. Genitourinary: deferred Musculoskeletal: No lower extremity tenderness nor edema.  No joint effusions. Neurologic:  Normal speech and language. No gross focal neurologic deficits are appreciated. No facial droop Skin:  Skin is warm, dry and intact. No rash noted. Psychiatric: Mood and affect are normal. Speech and behavior are normal.  ____________________________________________   LABS (all labs ordered are listed, but only abnormal results are displayed)  Results for orders placed or performed during the hospital encounter of 03/29/18 (from the past 24 hour(s))  Lipase, blood     Status: None   Collection Time: 03/29/18  4:33 PM  Result Value Ref Range  Lipase 31 11 - 51 U/L  Comprehensive metabolic panel     Status: Abnormal   Collection Time: 03/29/18  4:33 PM  Result Value Ref Range   Sodium 138 135 - 145 mmol/L   Potassium 3.3 (L) 3.5 - 5.1 mmol/L   Chloride 108 101 - 111 mmol/L   CO2 20 (L) 22 - 32 mmol/L   Glucose, Bld 111 (H) 65 - 99 mg/dL   BUN 13 6 - 20 mg/dL   Creatinine, Ser 1.611.00 0.61 - 1.24 mg/dL   Calcium 9.5 8.9 -  09.610.3 mg/dL   Total Protein 7.5 6.5 - 8.1 g/dL   Albumin 4.5 3.5 - 5.0 g/dL   AST 23 15 - 41 U/L   ALT 19 17 - 63 U/L   Alkaline Phosphatase 61 38 - 126 U/L   Total Bilirubin 1.5 (H) 0.3 - 1.2 mg/dL   GFR calc non Af Amer >60 >60 mL/min   GFR calc Af Amer >60 >60 mL/min   Anion gap 10 5 - 15  CBC     Status: Abnormal   Collection Time: 03/29/18  4:33 PM  Result Value Ref Range   WBC 13.2 (H) 3.8 - 10.6 K/uL   RBC 5.06 4.40 - 5.90 MIL/uL   Hemoglobin 16.1 13.0 - 18.0 g/dL   HCT 04.545.1 40.940.0 - 81.152.0 %   MCV 89.2 80.0 - 100.0 fL   MCH 31.8 26.0 - 34.0 pg   MCHC 35.7 32.0 - 36.0 g/dL   RDW 91.413.3 78.211.5 - 95.614.5 %   Platelets 197 150 - 440 K/uL   ____________________________________________  ____________________________________________  RADIOLOGY  I personally reviewed all radiographic images ordered to evaluate for the above acute complaints and reviewed radiology reports and findings.  These findings were personally discussed with the patient.  Please see medical record for radiology report.  ____________________________________________   PROCEDURES  Procedure(s) performed:  Procedures    Critical Care performed: no ____________________________________________   INITIAL IMPRESSION / ASSESSMENT AND PLAN / ED COURSE  Pertinent labs & imaging results that were available during my care of the patient were reviewed by me and considered in my medical decision making (see chart for details).   DDX: appy, stone, uti, ibd, msk strain  Eddie Olson is a 32 y.o. who presents to the ED with right lower quadrant pain as described above.  He is afebrile at this time but does have some mild discomfort in the right lower quadrant.  X-ray of abdomen shows no evidence of obstructive pattern.  Possible kidney stone.  Based on patient's presentation with leukocytosis however CT imaging will be ordered to evaluate further differentiate appendicitis versus stone.  Patient will be given IV  fluids.  Clinical Course as of Mar 30 2027  Sun Mar 29, 2018  1918 She reassessed.  Repeat abdominal exam soft benign.  Seems most clinically consistent with passed kidney stone.  No evidence of hydronephrosis.  Leukocytosis likely secondary to passed stone and pain.  No indication for antibiotics at this time.  Discussed signs and symptoms for which the patient should be admitted to the hospital.  Have discussed with the patient and available family all diagnostics and treatments performed thus far and all questions were answered to the best of my ability. The patient demonstrates understanding and agreement with plan.    [PR]    Clinical Course User Index [PR] Willy Eddyobinson, Nadya Hopwood, MD     As part of my medical decision making, I reviewed the following  data within the electronic MEDICAL RECORD NUMBER Nursing notes reviewed and incorporated, Labs reviewed, notes from prior ED visits.  ____________________________________________   FINAL CLINICAL IMPRESSION(S) / ED DIAGNOSES  Final diagnoses:  Right lower quadrant abdominal pain      NEW MEDICATIONS STARTED DURING THIS VISIT:  New Prescriptions   No medications on file     Note:  This document was prepared using Dragon voice recognition software and may include unintentional dictation errors.    Willy Eddy, MD 03/29/18 2029

## 2018-03-29 NOTE — ED Triage Notes (Signed)
Pt states that he was having severe RLQ abdominal pain this morning for 5 hours.  PT states after eating yogurt, pain returned.  Pt denies vomiting and diarrhea at this time.  Pt is A&Ox4, in NAD.

## 2018-03-31 ENCOUNTER — Other Ambulatory Visit: Payer: Self-pay

## 2018-03-31 ENCOUNTER — Encounter: Payer: Self-pay | Admitting: Emergency Medicine

## 2018-03-31 ENCOUNTER — Emergency Department
Admission: EM | Admit: 2018-03-31 | Discharge: 2018-03-31 | Disposition: A | Discharge status: Home / Self Care | Payer: BLUE CROSS/BLUE SHIELD | Attending: Emergency Medicine | Admitting: Emergency Medicine

## 2018-03-31 ENCOUNTER — Emergency Department: Payer: BLUE CROSS/BLUE SHIELD

## 2018-03-31 DIAGNOSIS — R1031 Right lower quadrant pain: Secondary | ICD-10-CM | POA: Diagnosis not present

## 2018-03-31 DIAGNOSIS — F17200 Nicotine dependence, unspecified, uncomplicated: Secondary | ICD-10-CM | POA: Insufficient documentation

## 2018-03-31 DIAGNOSIS — N201 Calculus of ureter: Secondary | ICD-10-CM | POA: Insufficient documentation

## 2018-03-31 DIAGNOSIS — Z79899 Other long term (current) drug therapy: Secondary | ICD-10-CM | POA: Diagnosis not present

## 2018-03-31 DIAGNOSIS — N1339 Other hydronephrosis: Secondary | ICD-10-CM | POA: Diagnosis not present

## 2018-03-31 LAB — URINALYSIS, COMPLETE (UACMP) WITH MICROSCOPIC
Bilirubin Urine: NEGATIVE
Glucose, UA: NEGATIVE mg/dL
Ketones, ur: 80 mg/dL — AB
Leukocytes, UA: NEGATIVE
Nitrite: NEGATIVE
PROTEIN: 30 mg/dL — AB
RBC / HPF: >50 RBC/hpf — ABNORMAL HIGH (ref 0–5)
Specific Gravity, Urine: 1.029 (ref 1.005–1.030)
pH: 5 (ref 5.0–8.0)

## 2018-03-31 LAB — BASIC METABOLIC PANEL
ANION GAP: 15 (ref 5–15)
BUN: 17 mg/dL (ref 6–20)
CALCIUM: 9.8 mg/dL (ref 8.9–10.3)
CO2: 19 mmol/L — AB (ref 22–32)
CREATININE: 1.28 mg/dL — AB (ref 0.61–1.24)
Chloride: 104 mmol/L (ref 98–111)
GFR calc Af Amer: >60 mL/min (ref >60)
GFR calc non Af Amer: >60 mL/min (ref >60)
GLUCOSE: 121 mg/dL — AB (ref 70–99)
Potassium: 3.2 mmol/L — ABNORMAL LOW (ref 3.5–5.1)
Sodium: 138 mmol/L (ref 135–145)

## 2018-03-31 LAB — CBC
HEMATOCRIT: 46.1 % (ref 40.0–52.0)
Hemoglobin: 16 g/dL (ref 13.0–18.0)
MCH: 31.5 pg (ref 26.0–34.0)
MCHC: 34.7 g/dL (ref 32.0–36.0)
MCV: 90.7 fL (ref 80.0–100.0)
Platelets: 226 10*3/uL (ref 150–440)
RBC: 5.08 MIL/uL (ref 4.40–5.90)
RDW: 13.2 % (ref 11.5–14.5)
WBC: 15.4 10*3/uL — ABNORMAL HIGH (ref 3.8–10.6)

## 2018-03-31 MED ORDER — MORPHINE SULFATE (PF) 4 MG/ML IV SOLN
INTRAVENOUS | Status: AC
  Filled 2018-03-31: qty 1

## 2018-03-31 MED ORDER — IBUPROFEN 200 MG PO TABS: 600.0000 mg | ORAL_TABLET | Freq: Four times a day (QID) | ORAL | 0 refills | Status: DC | PRN

## 2018-03-31 MED ORDER — ONDANSETRON 4 MG PO TBDP: 4.0000 mg | ORAL_TABLET | Freq: Three times a day (TID) | ORAL | 0 refills | Status: DC | PRN

## 2018-03-31 MED ORDER — TAMSULOSIN HCL 0.4 MG PO CAPS: 0.4000 mg | ORAL_CAPSULE | Freq: Every day | ORAL | 0 refills | Status: DC

## 2018-03-31 MED ORDER — ACETAMINOPHEN 325 MG PO TABS: 650.0000 mg | ORAL_TABLET | Freq: Four times a day (QID) | ORAL | 0 refills | Status: DC | PRN

## 2018-03-31 MED ORDER — ONDANSETRON HCL 4 MG/2ML IJ SOLN
INTRAMUSCULAR | Status: AC
  Administered 2018-03-31: 4 mg via INTRAVENOUS
  Filled 2018-03-31: qty 2

## 2018-03-31 MED ORDER — ONDANSETRON HCL 4 MG/2ML IJ SOLN
4.0000 mg | Freq: Once | INTRAMUSCULAR | Status: AC
  Administered 2018-03-31: 4 mg via INTRAVENOUS

## 2018-03-31 MED ORDER — MORPHINE SULFATE (PF) 4 MG/ML IV SOLN
4.0000 mg | Freq: Once | INTRAVENOUS | Status: AC
  Administered 2018-03-31: 4 mg via INTRAVENOUS

## 2018-03-31 MED ORDER — TAMSULOSIN HCL 0.4 MG PO CAPS
0.4000 mg | ORAL_CAPSULE | ORAL | Status: AC
  Administered 2018-03-31: 0.4 mg via ORAL
  Filled 2018-03-31: qty 1

## 2018-03-31 MED ORDER — OXYCODONE HCL 5 MG PO TABS: 5.0000 mg | ORAL_TABLET | Freq: Four times a day (QID) | ORAL | 0 refills | Status: DC | PRN

## 2018-03-31 MED ORDER — KETOROLAC TROMETHAMINE 30 MG/ML IJ SOLN
15.0000 mg | INTRAMUSCULAR | Status: AC
  Administered 2018-03-31: 15 mg via INTRAVENOUS
  Filled 2018-03-31: qty 1

## 2018-03-31 MED ORDER — DEXTROSE-NACL 5-0.45 % IV SOLN
Freq: Once | INTRAVENOUS | Status: AC
Start: 2018-03-31 — End: 2018-03-31
  Administered 2018-03-31: 1000 mL via INTRAVENOUS

## 2018-03-31 NOTE — Discharge Instructions (Signed)
Your ultrasound of the kidneys is unremarkable.  The radiologist confirms that your recent CT scan does show a 6mm kidney stone on the right side causing your symptoms.  Take Flomax, ibuprofen, and tylenol to control your pain.  Zofran can help for nausea.  Take oxycodone when necessary for severe pain. If your symptoms are not improved within 2 days, call Urology for further evaluation.

## 2018-03-31 NOTE — ED Provider Notes (Signed)
[facility] Emergency Department Provider Note  ____________________________________________  Time seen: Approximately 7:07 AM  I have reviewed the triage vital signs and the nursing notes.   HISTORY  Chief Complaint Flank Pain    HPI Eddie Olson is a 32 y.o. male with a history of GERD who complains of right flank pain for the past  2 days.  Constant, waxing and waning, radiating to the right lower quadrant.  No aggravating or alleviating factors.  Denies fever.  No vomiting or diarrhea or constipation.  No heavy lifting or recent trauma.  Has urinary frequency but no urgency or hematuria or dysuria.  He was seen in the ED March 29, 2017, CT scan which was reported as negative.  However, on my review of the imaging it appears that he has a sizable mid ureteral stone on the right.     Past Medical History:  Diagnosis Date  . Anxiety   . Heartburn   . Scoliosis      Patient Active Problem List   Diagnosis Date Noted  . GERD (gastroesophageal reflux disease) 04/23/2017  . Hiatal hernia 04/23/2017  . Early satiety 03/12/2017     History reviewed. No pertinent surgical history.   Prior to Admission medications   Medication Sig Start Date End Date Taking? Authorizing Provider  acetaminophen (TYLENOL) 325 MG tablet Take 2 tablets (650 mg total) by mouth every 6 (six) hours as needed. 03/31/18   Sharman Cheek, MD  ibuprofen (MOTRIN IB) 200 MG tablet Take 3 tablets (600 mg total) by mouth every 6 (six) hours as needed. 03/31/18   Sharman Cheek, MD  ondansetron (ZOFRAN ODT) 4 MG disintegrating tablet Take 1 tablet (4 mg total) by mouth every 8 (eight) hours as needed for nausea or vomiting. 03/31/18   Sharman Cheek, MD  oxyCODONE (ROXICODONE) 5 MG immediate release tablet Take 1 tablet (5 mg total) by mouth every 6 (six) hours as needed for breakthrough pain. 03/31/18   Sharman Cheek, MD  pantoprazole (PROTONIX) 40 MG tablet Take 1 tablet  (40 mg total) by mouth daily. 04/21/17   Malva Limes, MD  tamsulosin (FLOMAX) 0.4 MG CAPS capsule Take 1 capsule (0.4 mg total) by mouth daily. 03/31/18   Sharman Cheek, MD     Allergies Patient has no known allergies.   Family History  Problem Relation Age of Onset  . Cancer Maternal Uncle 44       Brain  . Diabetes Maternal Grandmother     Social History Social History   Tobacco Use  . Smoking status: Light Tobacco Smoker    Years: 5.00  . Smokeless tobacco: Never Used  . Tobacco comment: Hookah   Substance Use Topics  . Alcohol use: No  . Drug use: Yes    Types: Marijuana    Review of Systems  Constitutional:   No fever or chills.  ENT:   No sore throat. No rhinorrhea. Cardiovascular:   No chest pain or syncope. Respiratory:   No dyspnea or cough. Gastrointestinal: Positive as above right lower quadrant pain without vomiting and diarrhea.  Musculoskeletal:   Negative for focal pain or swelling All other systems reviewed and are negative except as documented above in ROS and HPI.  ____________________________________________   PHYSICAL EXAM:  VITAL SIGNS: ED Triage Vitals  Enc Vitals Group     BP 03/31/18 0221 131/89     Pulse Rate 03/31/18 0221 69     Resp 03/31/18 0221 16  Temp 03/31/18 0221 97.8 F (36.6 C)     Temp Source 03/31/18 0221 Oral     SpO2 03/31/18 0221 100 %     Weight 03/31/18 0213 115 lb (52.2 kg)     Height 03/31/18 0213 5\' 4"  (1.626 m)     Head Circumference --      Peak Flow --      Pain Score 03/31/18 0213 9     Pain Loc --      Pain Edu? --      Excl. in GC? --     Vital signs reviewed, nursing assessments reviewed.   Constitutional:   Alert and oriented.  Uncomfortable, non-toxic appearance. Eyes:   Conjunctivae are normal. EOMI. PERRL. ENT      Head:   Normocephalic and atraumatic.      Nose:   No congestion/rhinnorhea.       Mouth/Throat:   MMM, no pharyngeal erythema. No peritonsillar mass.       Neck:    No meningismus. Full ROM. Hematological/Lymphatic/Immunilogical:   No cervical lymphadenopathy. Cardiovascular:   RRR. Symmetric bilateral radial and DP pulses.  No murmurs.  Respiratory:   Normal respiratory effort without tachypnea/retractions. Breath sounds are clear and equal bilaterally. No wheezes/rales/rhonchi. Gastrointestinal:   Soft and nontender. Non distended. There is no CVA tenderness.  No rebound, rigidity, or guarding. Musculoskeletal:   Normal range of motion in all extremities. No joint effusions.  No lower extremity tenderness.  No edema. Neurologic:   Normal speech and language.  Motor grossly intact. No acute focal neurologic deficits are appreciated.  Skin:    Skin is warm, dry and intact. No rash noted.  No petechiae, purpura, or bullae.  ____________________________________________    LABS (pertinent positives/negatives) (all labs ordered are listed, but only abnormal results are displayed) Labs Reviewed  BASIC METABOLIC PANEL - Abnormal; Notable for the following components:      Result Value   Potassium 3.2 (*)    CO2 19 (*)    Glucose, Bld 121 (*)    Creatinine, Ser 1.28 (*)    All other components within normal limits  CBC - Abnormal; Notable for the following components:   WBC 15.4 (*)    All other components within normal limits  URINALYSIS, COMPLETE (UACMP) WITH MICROSCOPIC - Abnormal; Notable for the following components:   Color, Urine YELLOW (*)    APPearance HAZY (*)    Hgb urine dipstick LARGE (*)    Ketones, ur 80 (*)    Protein, ur 30 (*)    RBC / HPF >50 (*)    Bacteria, UA RARE (*)    All other components within normal limits   ____________________________________________   EKG    ____________________________________________    RADIOLOGY  [place] Renal  Result Date: 03/31/2018 CLINICAL DATA:  Initial evaluation for acute right flank pain. EXAM: RENAL / URINARY TRACT ULTRASOUND COMPLETE COMPARISON:  Prior CT from 03/29/2018.  FINDINGS: Right Kidney: Length: 9.5 cm. Mildly increased echogenicity within the renal parenchyma. Mild right hydronephrosis. No focal mass. Left Kidney: Length: 10.0 cm. Echogenicity within normal limits. No mass or hydronephrosis visualized. Bladder: Appears normal for degree of bladder distention. A right ureteral jet not visualized. IMPRESSION: 1. Mild right hydronephrosis. Upon review of prior CT, a 6 mm stone is seen positioned within the distal right ureter, most likely accounting for patient's symptoms. 2. Normal left kidney. Electronically Signed   By: Rise MuBenjamin  McClintock M.D.   On: 03/31/2018 06:09  ____________________________________________   PROCEDURES Procedures  ____________________________________________    CLINICAL IMPRESSION / ASSESSMENT AND PLAN / ED COURSE  Pertinent labs & imaging results that were available during my care of the patient were reviewed by me and considered in my medical decision making (see chart for details).    Patient presents with colicky right flank pain, highly consistent with ureteral colic.  Reviewing the electronic medical record and his CT images from 2 days ago does appear to show a sizable kidney stone on the right mid ureter.  Patient was discharged home at that time with a impression of recently passed kidney stone.  However, symptoms have persisted.  He is not septic, not in severe and limiting pain.  Ultrasound obtained, mild right hydro-, doubt high-grade obstruction.  Radiology review review does confirm suspected 6 mm right ureteral stone.  Patient counseled, follow-up with urology if symptoms not improving in the next 2 days.  Return to ED if fever or vomiting or other serious illnesses.  Given his benign exam, low suspicion of appendicitis at this time.  Labs are reassuring, vital signs are normal.     ____________________________________________   FINAL CLINICAL IMPRESSION(S) / ED DIAGNOSES    Final diagnoses:   Ureterolithiasis  Right flank pain   ED Discharge Orders        Ordered    oxyCODONE (ROXICODONE) 5 MG immediate release tablet  Every 6 hours PRN     03/31/18 0707    ibuprofen (MOTRIN IB) 200 MG tablet  Every 6 hours PRN     03/31/18 0707    acetaminophen (TYLENOL) 325 MG tablet  Every 6 hours PRN     03/31/18 0707    ondansetron (ZOFRAN ODT) 4 MG disintegrating tablet  Every 8 hours PRN     03/31/18 0707    tamsulosin (FLOMAX) 0.4 MG CAPS capsule  Daily     03/31/18 0707      Portions of this note were generated with dragon dictation software. Dictation errors may occur despite best attempts at proofreading.    Sharman Cheek, MD 03/31/18 [phone]

## 2018-03-31 NOTE — ED Triage Notes (Signed)
Pt to triage via w/c; st seen here Sunday for right flank pain radiating into abd accomp by nausea with negative findings; pt denies hx of same

## 2018-05-07 ENCOUNTER — Other Ambulatory Visit: Payer: Self-pay | Admitting: Family Medicine

## 2018-06-22 DIAGNOSIS — L218 Other seborrheic dermatitis: Secondary | ICD-10-CM | POA: Diagnosis not present

## 2019-11-01 ENCOUNTER — Other Ambulatory Visit: Payer: Self-pay | Admitting: Family Medicine

## 2019-11-01 NOTE — Telephone Encounter (Signed)
Overdue for follow up. Last office visit was 04/23/2017. Patient needs to schedule follow up office visit before we send in refills. Left detailed message of voice message system.

## 2019-11-01 NOTE — Telephone Encounter (Signed)
Requested medication (s) are due for refill today: no  Requested medication (s) are on the active medication list: yes  Last refill: 05/20/2018  Future visit scheduled: no  Notes to clinic:  no valid encounter within last 12 months Review for refill   Requested Prescriptions  Pending Prescriptions Disp Refills   pantoprazole (PROTONIX) 40 MG tablet [Pharmacy Med Name: PANTOPRAZOLE SOD DR 40 MG TAB] 90 tablet 4    Sig: TAKE 1 TABLET BY MOUTH EVERY DAY      Gastroenterology: Proton Pump Inhibitors Failed - 11/01/2019  1:32 PM      Failed - Valid encounter within last 12 months    Recent Outpatient Visits           2 years ago Gastroesophageal reflux disease, esophagitis presence not specified   [facility] Malva Limes, MD   2 years ago Early satiety   [facility] Sherrie Mustache, Demetrios Isaacs, MD

## 2019-11-02 ENCOUNTER — Encounter: Payer: Self-pay | Admitting: Family Medicine

## 2019-11-02 ENCOUNTER — Ambulatory Visit (INDEPENDENT_AMBULATORY_CARE_PROVIDER_SITE_OTHER): Payer: BC Managed Care – PPO | Admitting: Family Medicine

## 2019-11-02 ENCOUNTER — Other Ambulatory Visit: Payer: Self-pay

## 2019-11-02 VITALS — BP 118/82 | HR 68 | Temp 97.3°F | Resp 16 | Ht 64.0 in | Wt 135.0 lb

## 2019-11-02 DIAGNOSIS — K219 Gastro-esophageal reflux disease without esophagitis: Secondary | ICD-10-CM | POA: Diagnosis not present

## 2019-11-02 MED ORDER — PANTOPRAZOLE SODIUM 40 MG PO TBEC: 40.0000 mg | DELAYED_RELEASE_TABLET | Freq: Every day | ORAL | 2 refills | Status: DC

## 2019-11-02 NOTE — Progress Notes (Signed)
       Patient: Eddie Olson Male    DOB: January 18, 1986   33 y.o.   MRN: [id number] Visit Date: 11/02/2019  Today's Provider: Mila Merry, MD   Chief Complaint  Patient presents with  . Gastroesophageal Reflux   Subjective:     HPI  Follow up for GERD:  The patient was last seen for this 2.5 years ago. Changes made at last visit include none; patient advised to continue Pantoprazole.  He reports excellent compliance with treatment. Patient reports that he took some time off from medication for several months, but restarted about 2 weeks ago when he started having heartburn again.  He feels that condition is Improved. Patient C/O "pulse sensation" of muscles on his chest right and left. Patient reports that since restarting Pantoprazole symptom has improved. Patient denies any chest pain, shortness of breath, nausea or vomiting.  He is not having side effects.   ------------------------------------------------------------------------------------  No Known Allergies   Current Outpatient Medications:  .  pantoprazole (PROTONIX) 40 MG tablet, TAKE 1 TABLET BY MOUTH EVERY DAY, Disp: 90 tablet, Rfl: 4  Review of Systems  Constitutional: Negative for appetite change, chills and fever.  Respiratory: Negative for chest tightness, shortness of breath and wheezing.   Cardiovascular: Negative for chest pain and palpitations.  Gastrointestinal: Negative for abdominal pain, nausea and vomiting.  Musculoskeletal: Positive for myalgias.    Social History   Tobacco Use  . Smoking status: Light Tobacco Smoker    Years: 5.00  . Smokeless tobacco: Never Used  . Tobacco comment: Hookah   Substance Use Topics  . Alcohol use: No      Objective:   BP 118/82 (BP Location: Right Arm, Patient Position: Sitting, Cuff Size: Normal)   Pulse 68   Temp (!) 97.3 F (36.3 C) (Temporal)   Resp 16   Ht 5\' 4"  (1.626 m)   Wt 135 lb (61.2 kg)   BMI 23.17 kg/m  Vitals:   11/02/19 1416  BP:  118/82  Pulse: 68  Resp: 16  Temp: (!) 97.3 F (36.3 C)  TempSrc: Temporal  Weight: 135 lb (61.2 kg)  Height: 5\' 4"  (1.626 m)  Body mass index is 23.17 kg/m.   Physical Exam  General appearance: Well developed, well nourished male, cooperative and in no acute distress Head: Normocephalic, without obvious abnormality, atraumatic Respiratory: Respirations even and unlabored, normal respiratory rate Extremities: All extremities are intact.  Skin: Skin color, texture, turgor normal. No rashes seen  Psych: Appropriate mood and affect. Neurologic: Mental status: Alert, oriented to person, place, and time, thought content appropriate.       Assessment & Plan    1. Gastroesophageal reflux disease without esophagitis Well controlled with prn pantoprazole which was refilled today.      11/04/19, MD  [facility]

## 2019-11-02 NOTE — Patient Instructions (Signed)
Gastroesophageal Reflux Disease, Adult Gastroesophageal reflux (GER) happens when acid from the stomach flows up into the tube that connects the mouth and the stomach (esophagus). Normally, food travels down the esophagus and stays in the stomach to be digested. With GER, food and stomach acid sometimes move back up into the esophagus. You may have a disease called gastroesophageal reflux disease (GERD) if the reflux: Happens often. Causes frequent or very bad symptoms. Causes problems such as damage to the esophagus. When this happens, the esophagus becomes sore and swollen (inflamed). Over time, GERD can make small holes (ulcers) in the lining of the esophagus. What are the causes? This condition is caused by a problem with the muscle between the esophagus and the stomach. When this muscle is weak or not normal, it does not close properly to keep food and acid from coming back up from the stomach. The muscle can be weak because of: Tobacco use. Pregnancy. Having a certain type of hernia (hiatal hernia). Alcohol use. Certain foods and drinks, such as coffee, chocolate, onions, and peppermint. What increases the risk? You are more likely to develop this condition if you: Are overweight. Have a disease that affects your connective tissue. Use NSAID medicines. What are the signs or symptoms? Symptoms of this condition include: Heartburn. Difficult or painful swallowing. The feeling of having a lump in the throat. A bitter taste in the mouth. Bad breath. Having a lot of saliva. Having an upset or bloated stomach. Belching. Chest pain. Different conditions can cause chest pain. Make sure you see your doctor if you have chest pain. Shortness of breath or noisy breathing (wheezing). Ongoing (chronic) cough or a cough at night. Wearing away of the surface of teeth (tooth enamel). Weight loss. How is this treated? Treatment will depend on how bad your symptoms are. Your doctor may  suggest: Changes to your diet. Medicine. Surgery. Follow these instructions at home: Eating and drinking  Follow a diet as told by your doctor. You may need to avoid foods and drinks such as: Coffee and tea (with or without caffeine). Drinks that contain alcohol. Energy drinks and sports drinks. Bubbly (carbonated) drinks or sodas. Chocolate and cocoa. Peppermint and mint flavorings. Garlic and onions. Horseradish. Spicy and acidic foods. These include peppers, chili powder, curry powder, vinegar, hot sauces, and BBQ sauce. Citrus fruit juices and citrus fruits, such as oranges, lemons, and limes. Tomato-based foods. These include red sauce, chili, salsa, and pizza with red sauce. Fried and fatty foods. These include donuts, french fries, potato chips, and high-fat dressings. High-fat meats. These include hot dogs, rib eye steak, sausage, ham, and bacon. High-fat dairy items, such as whole milk, butter, and cream cheese. Eat small meals often. Avoid eating large meals. Avoid drinking large amounts of liquid with your meals. Avoid eating meals during the 2-3 hours before bedtime. Avoid lying down right after you eat. Do not exercise right after you eat. Lifestyle  Do not use any products that contain nicotine or tobacco. These include cigarettes, e-cigarettes, and chewing tobacco. If you need help quitting, ask your doctor. Try to lower your stress. If you need help doing this, ask your doctor. If you are overweight, lose an amount of weight that is healthy for you. Ask your doctor about a safe weight loss goal. General instructions Pay attention to any changes in your symptoms. Take over-the-counter and prescription medicines only as told by your doctor. Do not take aspirin, ibuprofen, or other NSAIDs unless your doctor says it  is okay. Wear loose clothes. Do not wear anything tight around your waist. Raise (elevate) the head of your bed about 6 inches (15 cm). Avoid bending over  if this makes your symptoms worse. Keep all follow-up visits as told by your doctor. This is important. Contact a doctor if: You have new symptoms. You lose weight and you do not know why. You have trouble swallowing or it hurts to swallow. You have wheezing or a cough that keeps happening. Your symptoms do not get better with treatment. You have a hoarse voice. Get help right away if: You have pain in your arms, neck, jaw, teeth, or back. You feel sweaty, dizzy, or light-headed. You have chest pain or shortness of breath. You throw up (vomit) and your throw-up looks like blood or coffee grounds. You pass out (faint). Your poop (stool) is bloody or black. You cannot swallow, drink, or eat. Summary If a person has gastroesophageal reflux disease (GERD), food and stomach acid move back up into the esophagus and cause symptoms or problems such as damage to the esophagus. Treatment will depend on how bad your symptoms are. Follow a diet as told by your doctor. Take all medicines only as told by your doctor. This information is not intended to replace advice given to you by your health care provider. Make sure you discuss any questions you have with your health care provider. Document Revised: 04/01/2018 Document Reviewed: 04/01/2018 Elsevier Patient Education  [employer] . Please review the attached list of medications and notify my office if there are any errors.   . Please bring all of your medications to every appointment so we can make sure that our medication list is the same as yours.

## 2020-01-22 ENCOUNTER — Other Ambulatory Visit: Payer: Self-pay | Admitting: Family Medicine

## 2020-01-22 DIAGNOSIS — K219 Gastro-esophageal reflux disease without esophagitis: Secondary | ICD-10-CM

## 2020-01-22 NOTE — Telephone Encounter (Signed)
Pharmacy comment: Alternative Requested:PT PAYING $0 FOR PANTO. PT REQUESTING LOWER-COST ALTERNATIVE: OMEPR. $12; If approved will need new order. Routing to office to review.

## 2020-01-23 NOTE — Telephone Encounter (Signed)
I don't understand this message. He is currently on pantoprazole, which according to this message has 0 copay, but it sounds like pharmacy is requesting alternative. refill request is for omeprazole.  i'm confused.

## 2020-01-24 NOTE — Telephone Encounter (Signed)
I spoke with the pharmacy and the Pantoprazole is actually $40 something and the Omeprazole 40 is $19.  Is it ok to switch for cost reasons

## 2020-04-25 ENCOUNTER — Ambulatory Visit (INDEPENDENT_AMBULATORY_CARE_PROVIDER_SITE_OTHER): Payer: 59 | Admitting: Family Medicine

## 2020-04-25 ENCOUNTER — Other Ambulatory Visit: Payer: Self-pay

## 2020-04-25 ENCOUNTER — Encounter: Payer: Self-pay | Admitting: Family Medicine

## 2020-04-25 VITALS — BP 104/73 | HR 92 | Temp 97.3°F | Resp 16 | Ht 64.0 in | Wt 120.0 lb

## 2020-04-25 DIAGNOSIS — Z0000 Encounter for general adult medical examination without abnormal findings: Secondary | ICD-10-CM | POA: Diagnosis not present

## 2020-04-25 DIAGNOSIS — F320 Major depressive disorder, single episode, mild: Secondary | ICD-10-CM

## 2020-04-25 DIAGNOSIS — Z23 Encounter for immunization: Secondary | ICD-10-CM

## 2020-04-25 MED ORDER — BUPROPION HCL ER (XL) 150 MG PO TB24: 150.0000 mg | ORAL_TABLET | Freq: Every day | ORAL | 1 refills | Status: DC

## 2020-04-25 NOTE — Patient Instructions (Signed)
.   Please review the attached list of medications and notify my office if there are any errors.   . Please bring all of your medications to every appointment so we can make sure that our medication list is the same as yours.   

## 2020-04-25 NOTE — Progress Notes (Signed)
Complete physical exam   Patient: Eddie Olson   DOB: [birth year]   33 y.o. Male  MRN: [id number] Visit Date: 04/25/2020  Today's healthcare provider: Mila Merry, MD   Chief Complaint  Patient presents with  . Annual Exam   Subjective    [name] is a 34 y.o. male who presents today for a complete physical exam.  He reports consuming a general diet. The patient does not participate in regular exercise at present. He generally feels well. He reports sleeping fairly well. He does have additional problems to discuss today.  HPI  GERD, Follow up:  The patient was last seen for GERD 6 months ago. Changes made since that visit include none; continue Pantoprazole as presribed. Patient was later changed to Omeprazole due to cost.  He reports good compliance with treatment. He is not having side effects.    ----------------------------------------------------------------------------------------- Depression Has felt less motivation and energy for several months, especially since having to work at home due to the pandemic. Appetite is poor, no trouble sleeping. No anxiety or restlessness.    Office Visit from 04/25/2020 in [facility]  PHQ-9 Total Score 12       Past Medical History:  Diagnosis Date  . Anxiety   . Heartburn   . Scoliosis    No past surgical history on file. Social History   Socioeconomic History  . Marital status: Single    Spouse name: Not on file  . Number of children: Not on file  . Years of education: college   . Highest education level: Not on file  Occupational History  . Occupation: Lead Teller  Tobacco Use  . Smoking status: Former Smoker    Years: 5.00  . Smokeless tobacco: Never Used  Substance and Sexual Activity  . Alcohol use: No  . Drug use: Yes    Types: Marijuana  . Sexual activity: Not on file  Other Topics Concern  . Not on file  Social History Narrative  . Not on file   Social Determinants of  Health   Financial Resource Strain:   . Difficulty of Paying Living Expenses:   Food Insecurity:   . Worried About Programme researcher, broadcasting/film/video in the Last Year:   . Barista in the Last Year:   Transportation Needs:   . Freight forwarder (Medical):   Marland Kitchen Lack of Transportation (Non-Medical):   Physical Activity:   . Days of Exercise per Week:   . Minutes of Exercise per Session:   Stress:   . Feeling of Stress :   Social Connections:   . Frequency of Communication with Friends and Family:   . Frequency of Social Gatherings with Friends and Family:   . Attends Religious Services:   . Active Member of Clubs or Organizations:   . Attends Banker Meetings:   Marland Kitchen Marital Status:   Intimate Partner Violence:   . Fear of Current or Ex-Partner:   . Emotionally Abused:   Marland Kitchen Physically Abused:   . Sexually Abused:    Family Status  Relation Name Status  . Mother  Alive  . Father  Alive  . Mat Uncle  Deceased  . MGM  Alive  . MGF  Alive  . PGM  Alive  . PGF  Alive   Family History  Problem Relation Age of Onset  . Cancer Maternal Uncle 44       Brain  . Diabetes Maternal Grandmother  No Known Allergies  Patient Care Team: Malva Limes, MD as PCP - General (Family Medicine)   Medications: Outpatient Medications Prior to Visit  Medication Sig  . omeprazole (PRILOSEC) 20 MG capsule Please specify directions, refills and quantity (Patient taking differently: Take 20 mg by mouth daily. )   No facility-administered medications prior to visit.    Review of Systems  Constitutional: Positive for appetite change. Negative for chills, fatigue and fever.  HENT: Negative for congestion, ear pain, hearing loss, nosebleeds and trouble swallowing.   Eyes: Negative for pain and visual disturbance.  Respiratory: Negative for cough, chest tightness and shortness of breath.   Cardiovascular: Negative for chest pain, palpitations and leg swelling.  Gastrointestinal:  Positive for constipation and diarrhea. Negative for abdominal pain, blood in stool, nausea and vomiting.  Endocrine: Negative for polydipsia, polyphagia and polyuria.  Genitourinary: Negative for dysuria and flank pain.  Musculoskeletal: Negative for arthralgias, back pain, joint swelling, myalgias and neck stiffness.  Skin: Negative for color change, rash and wound.  Neurological: Negative for dizziness, tremors, seizures, speech difficulty, weakness, light-headedness and headaches.  Psychiatric/Behavioral: Positive for decreased concentration. Negative for behavioral problems, confusion, dysphoric mood and sleep disturbance. The patient is not nervous/anxious.   All other systems reviewed and are negative.     Objective    Temp (!) 97.3 F (36.3 C) (Temporal)   Resp 16   Ht 5\' 4"  (1.626 m)   Wt 120 lb (54.4 kg)   BMI 20.60 kg/m    Physical Exam   General Appearance:    Well developed, well nourished male. Alert, cooperative, in no acute distress, appears stated age  Head:    Normocephalic, without obvious abnormality, atraumatic  Eyes:    PERRL, conjunctiva/corneas clear, EOM's intact, fundi    benign, both eyes       Ears:    Normal TM's and external ear canals, both ears  Nose:   Nares normal, septum midline, mucosa normal, no drainage   or sinus tenderness  Throat:   Lips, mucosa, and tongue normal; teeth and gums normal  Neck:   Supple, symmetrical, trachea midline, no adenopathy;       thyroid:  No enlargement/tenderness/nodules; no carotid   bruit or JVD  Back:     Symmetric, no curvature, ROM normal, no CVA tenderness  Lungs:     Clear to auscultation bilaterally, respirations unlabored  Chest wall:    No tenderness or deformity  Heart:    Normal heart rate. Normal rhythm. No murmurs, rubs, or gallops.  S1 and S2 normal  Abdomen:     Soft, non-tender, bowel sounds active all four quadrants,    no masses, no organomegaly  Genitalia:    deferred  Rectal:    deferred    Extremities:   All extremities are intact. No cyanosis or edema  Pulses:   2+ and symmetric all extremities  Skin:   Skin color, texture, turgor normal, no rashes or lesions  Lymph nodes:   Cervical, supraclavicular, and axillary nodes normal  Neurologic:   CNII-XII intact. Normal strength, sensation and reflexes      throughout     Last depression screening scores PHQ 2/9 Scores 04/25/2020 11/02/2019 03/10/2017  PHQ - 2 Score 4 0 1  PHQ- 9 Score 12 - 10   Last fall risk screening Fall Risk  11/02/2019  Falls in the past year? 0  Number falls in past yr: 0  Injury with Fall? 0  Risk for  fall due to : No Fall Risks  Follow up Falls evaluation completed   Last Audit-C alcohol use screening Alcohol Use Disorder Test (AUDIT) 04/25/2020  1. How often do you have a drink containing alcohol? 0  2. How many drinks containing alcohol do you have on a typical day when you are drinking? 0  3. How often do you have six or more drinks on one occasion? 0  AUDIT-C Score 0  Alcohol Brief Interventions/Follow-up AUDIT Score <7 follow-up not indicated   A score of 3 or more in women, and 4 or more in men indicates increased risk for alcohol abuse, EXCEPT if all of the points are from question 1   No results found for any visits on 04/25/20.  Assessment & Plan    Routine Health Maintenance and Physical Exam  Exercise Activities and Dietary recommendations Goals   None     Immunization History  Administered Date(s) Administered  . Moderna SARS-COVID-2 Vaccination 02/25/2020, 03/24/2020  . Td 03/17/2006    Health Maintenance  Topic Date Due  . Hepatitis C Screening  Never done  . HIV Screening  Never done  . TETANUS/TDAP  03/17/2016  . INFLUENZA VACCINE  05/07/2020  . COVID-19 Vaccine  Completed    Discussed health benefits of physical activity, and encouraged him to engage in regular exercise appropriate for his age and condition.  1. Annual physical exam  - Comprehensive  metabolic panel - CBC - TSH  2. Need for tetanus, diphtheria, and acellular pertussis (Tdap) vaccine in patient of adolescent age or older  - Tdap vaccine greater than or equal to 7yo IM  3. Current mild episode of major depressive disorder without prior episode (HCC) start- buPROPion (WELLBUTRIN XL) 150 MG 24 hr tablet; Take 1 tablet (150 mg total) by mouth daily.  Dispense: 30 tablet; Refill: 1 - Comprehensive metabolic panel - CBC - TSH   Follow up in 1 month.  No follow-ups on file.     The entirety of the information documented in the History of Present Illness, Review of Systems and Physical Exam were personally obtained by me. Portions of this information were initially documented by the CMA and reviewed by me for thoroughness and accuracy.      Mila Merry, MD  [facility] [phone] (phone) [phone] (fax)  [facility]

## 2020-04-26 LAB — COMPREHENSIVE METABOLIC PANEL
ALT: 11 IU/L (ref 0–44)
AST: 12 IU/L (ref 0–40)
Albumin/Globulin Ratio: 1.9 (ref 1.2–2.2)
Albumin: 4.9 g/dL (ref 4.0–5.0)
Alkaline Phosphatase: 67 IU/L (ref 48–121)
BUN/Creatinine Ratio: 8 — ABNORMAL LOW (ref 9–20)
BUN: 9 mg/dL (ref 6–20)
Bilirubin Total: 1 mg/dL (ref 0.0–1.2)
CO2: 19 mmol/L — ABNORMAL LOW (ref 20–29)
Calcium: 10 mg/dL (ref 8.7–10.2)
Chloride: 105 mmol/L (ref 96–106)
Creatinine, Ser: 1.1 mg/dL (ref 0.76–1.27)
GFR calc Af Amer: 102 mL/min/{1.73_m2} (ref >59)
GFR calc non Af Amer: 88 mL/min/{1.73_m2} (ref >59)
Globulin, Total: 2.6 g/dL (ref 1.5–4.5)
Glucose: 95 mg/dL (ref 65–99)
Potassium: 4.2 mmol/L (ref 3.5–5.2)
Sodium: 139 mmol/L (ref 134–144)
Total Protein: 7.5 g/dL (ref 6.0–8.5)

## 2020-04-26 LAB — CBC
Hematocrit: 44.6 % (ref 37.5–51.0)
Hemoglobin: 15.8 g/dL (ref 13.0–17.7)
MCH: 31.5 pg (ref 26.6–33.0)
MCHC: 35.4 g/dL (ref 31.5–35.7)
MCV: 89 fL (ref 79–97)
Platelets: 220 10*3/uL (ref 150–450)
RBC: 5.01 x10E6/uL (ref 4.14–5.80)
RDW: 12.5 % (ref 11.6–15.4)
WBC: 8 10*3/uL (ref 3.4–10.8)

## 2020-04-26 LAB — TSH: TSH: 1.35 u[IU]/mL (ref 0.450–4.500)

## 2020-05-18 ENCOUNTER — Other Ambulatory Visit: Payer: Self-pay | Admitting: Family Medicine

## 2020-05-18 DIAGNOSIS — F320 Major depressive disorder, single episode, mild: Secondary | ICD-10-CM

## 2020-05-30 ENCOUNTER — Encounter: Payer: Self-pay | Admitting: Family Medicine

## 2020-05-30 ENCOUNTER — Other Ambulatory Visit: Payer: Self-pay

## 2020-05-30 ENCOUNTER — Ambulatory Visit: Payer: 59 | Admitting: Family Medicine

## 2020-05-30 VITALS — BP 108/68 | HR 74 | Temp 98.1°F | Resp 16 | Wt 120.0 lb

## 2020-05-30 DIAGNOSIS — F320 Major depressive disorder, single episode, mild: Secondary | ICD-10-CM

## 2020-05-30 MED ORDER — ESCITALOPRAM OXALATE 5 MG PO TABS: 5.0000 mg | ORAL_TABLET | Freq: Every day | ORAL | 1 refills | Status: DC

## 2020-05-30 NOTE — Progress Notes (Signed)
I,Magenta Schmiesing L Reilyn Nelson,acting as a scribe for Mila Merry, MD.,have documented all relevant documentation on the behalf of Mila Merry, MD,as directed by  Mila Merry, MD while in the presence of Mila Merry, MD.  Established patient visit   Patient: Eddie Olson   DOB: Mar 31, 1986   33 y.o. Male  MRN: [id number] Visit Date: 05/30/2020  Today's healthcare provider: Mila Merry, MD   Chief Complaint  Patient presents with  . Depression   Subjective    HPI  Depression, Follow-up  He  was last seen for this 04/25/2020. Changes made at last visit include starting Bupropion (WELLBUTRIN XL) 150mg  daily and advising patient to follow up in 1 month.    He reports fair compliance with treatment. Patient stopped taking the last 3-4 pills due to it causing nausea. He also feels that the medication caused him to have more negative thoughts.    He reports fair tolerance of treatment. Current symptoms include: depressed mood He feels he is Unchanged since last visit.  Depression screen [facility] 2/9 05/30/2020 04/25/2020 11/02/2019  Decreased Interest 1 2 0  Down, Depressed, Hopeless 3 2 0  PHQ - 2 Score 4 4 0  Altered sleeping 0 0 -  Tired, decreased energy 1 1 -  Change in appetite 3 3 -  Feeling bad or failure about yourself  3 1 -  Trouble concentrating 3 2 -  Moving slowly or fidgety/restless 0 0 -  Suicidal thoughts 0 1 -  PHQ-9 Score 14 12 -  Difficult doing work/chores Very difficult Very difficult -    -----------------------------------------------------------------------------------------   Medications: Outpatient Medications Prior to Visit  Medication Sig Note  . omeprazole (PRILOSEC) 20 MG capsule Please specify directions, refills and quantity (Patient taking differently: Take 20 mg by mouth daily. )   . [DISCONTINUED] buPROPion (WELLBUTRIN XL) 150 MG 24 hr tablet TAKE 1 TABLET BY MOUTH EVERY DAY 05/30/2020: nause, made negative thoughs worse   No  facility-administered medications prior to visit.    Review of Systems  Constitutional: Negative for appetite change, chills and fever.  Respiratory: Negative for chest tightness, shortness of breath and wheezing.   Cardiovascular: Negative for chest pain and palpitations.  Gastrointestinal: Negative for abdominal pain, nausea and vomiting.  Psychiatric/Behavioral: Positive for dysphoric mood.     Objective    BP 108/68 (BP Location: Left Arm, Patient Position: Sitting, Cuff Size: Normal)   Pulse 74   Temp 98.1 F (36.7 C) (Oral)   Resp 16   Wt 120 lb (54.4 kg)   BMI 20.60 kg/m   Physical Exam  General appearance: Well developed, well nourished male, cooperative and in no acute distress Head: Normocephalic, without obvious abnormality, atraumatic Respiratory: Respirations even and unlabored, normal respiratory rate Extremities: All extremities are intact.  Skin: Skin color, texture, turgor normal. No rashes seen  Psych: Appropriate mood and affect. Neurologic: Mental status: Alert, oriented to person, place, and time, thought content appropriate.     Assessment & Plan     1. Current mild episode of major depressive disorder without prior episode (HCC) Did not tolerate or benefit from trial of bupropion. Will try - escitalopram (LEXAPRO) 5 MG tablet; Take 1 tablet (5 mg total) by mouth daily.  Dispense: 30 tablet; Refill: 1  Counseled to start 1/2 tablet daily and titrate as tolerated to 2 tablets daily. Follow up in about 4 weeks.   No follow-ups on file.      The entirety of the information  documented in the History of Present Illness, Review of Systems and Physical Exam were personally obtained by me. Portions of this information were initially documented by the CMA and reviewed by me for thoroughness and accuracy.      Lelon Huh, MD  [facility] [phone] (phone) [phone] (fax)  [place]

## 2020-05-30 NOTE — Patient Instructions (Addendum)
   Start escitalopram (Lexapro) 10mg  tablet. Start with 1/2 tablet daily for 6 days, then 1 tablet daily for 6 days, then 2 tablets daily

## 2020-06-21 ENCOUNTER — Other Ambulatory Visit: Payer: Self-pay | Admitting: Family Medicine

## 2020-06-21 DIAGNOSIS — F320 Major depressive disorder, single episode, mild: Secondary | ICD-10-CM

## 2020-06-21 NOTE — Telephone Encounter (Signed)
Pharmacy request changes to original Rx- new start medication. Sent for review of request 90 day supply

## 2020-06-27 ENCOUNTER — Encounter: Payer: Self-pay | Admitting: Family Medicine

## 2020-06-27 ENCOUNTER — Other Ambulatory Visit: Payer: Self-pay

## 2020-06-27 ENCOUNTER — Ambulatory Visit: Payer: 59 | Admitting: Family Medicine

## 2020-06-27 VITALS — BP 103/66 | HR 62 | Temp 97.9°F | Resp 16 | Wt 120.0 lb

## 2020-06-27 DIAGNOSIS — Z23 Encounter for immunization: Secondary | ICD-10-CM | POA: Diagnosis not present

## 2020-06-27 DIAGNOSIS — F320 Major depressive disorder, single episode, mild: Secondary | ICD-10-CM

## 2020-06-27 NOTE — Progress Notes (Signed)
I,Roshena L Chambers,acting as a scribe for Mila Merry, MD.,have documented all relevant documentation on the behalf of Mila Merry, MD,as directed by  Mila Merry, MD while in the presence of Mila Merry, MD.   Established patient visit   Patient: Eddie Olson   DOB: April 26, 1986   33 y.o. Male  MRN: [id number] Visit Date: 06/27/2020  Today's healthcare provider: Mila Merry, MD   Chief Complaint  Patient presents with  . Depression   Subjective    HPI  Depression, Follow-up  He  was last seen for this 05/30/2020.  Changes made at last visit include starting (LEXAPRO) 5 MG tablet. Counseled to start 1/2 tablet daily and titrate as tolerated to 2 tablets daily. Follow up in about 4 weeks   He reports good compliance with treatment. He is not having side effects.   He reports good tolerance of treatment. Current symptoms include: depressed mood He feels he is Improved since last visit. Patient is currently taking 1 tablet daily of Lexapro 5mg .  Depression screen [facility] 2/9 06/27/2020 05/30/2020 04/25/2020  Decreased Interest 0 1 2  Down, Depressed, Hopeless 1 3 2   PHQ - 2 Score 1 4 4   Altered sleeping 0 0 0  Tired, decreased energy 1 1 1   Change in appetite 1 3 3   Feeling bad or failure about yourself  1 3 1   Trouble concentrating 0 3 2  Moving slowly or fidgety/restless 0 0 0  Suicidal thoughts 0 0 1  PHQ-9 Score 4 14 12   Difficult doing work/chores Not difficult at all Very difficult Very difficult    -----------------------------------------------------------------------------------------     Medications: Outpatient Medications Prior to Visit  Medication Sig  . escitalopram (LEXAPRO) 5 MG tablet TAKE 1 TABLET BY MOUTH EVERY DAY  . omeprazole (PRILOSEC) 20 MG capsule Please specify directions, refills and quantity (Patient taking differently: Take 20 mg by mouth daily. )   No facility-administered medications prior to visit.    Review of  Systems    Objective    BP 103/66 (BP Location: Right Arm, Patient Position: Sitting, Cuff Size: Normal)   Pulse 62   Temp 97.9 F (36.6 C) (Oral)   Resp 16   Wt 120 lb (54.4 kg)   BMI 20.60 kg/m    Physical Exam General appearance: Well developed, well nourished male, cooperative and in no acute distress Head: Normocephalic, without obvious abnormality, atraumatic Respiratory: Respirations even and unlabored, normal respiratory rate Extremities: All extremities are intact.  Skin: Skin color, texture, turgor normal. No rashes seen  Psych: Appropriate mood and affect. Neurologic: Mental status: Alert, oriented to person, place, and time, thought content appropriate.   No results found for any visits on 06/27/20.  Assessment & Plan     1. Current mild episode of major depressive disorder without prior episode (HCC) Improved with 5mg  escitalopram, but not completely controlled, he would like to titrate up to 10mg . He has 90 5mg  tablets waiting at the pharmacy. He will use this to titrate to 10mg  and if tolerating will send in prescription for 10mg  tablets in about 6 weeks.   2. Need for influenza vaccination  - Flu Vaccine QUAD 36+ mos IM (Fluarix/Fluzone)   Virtual visit follow up in 3 months.       The entirety of the information documented in the History of Present Illness, Review of Systems and Physical Exam were personally obtained by me. Portions of this information were initially documented by the CMA and reviewed  by me for thoroughness and accuracy.      Lelon Huh, MD  [facility] [phone] (phone) [phone] (fax)  [place]

## 2020-06-27 NOTE — Patient Instructions (Addendum)
.   Increase escitalopram to 1 1/2 tablets every day for the next 8 days, then increase to 2 tablets (10mg ) once a day. I'll send in a prescription for 10mg  tablets before your current bottle runs out.

## 2020-08-01 ENCOUNTER — Other Ambulatory Visit: Payer: Self-pay | Admitting: Family Medicine

## 2020-08-01 DIAGNOSIS — F320 Major depressive disorder, single episode, mild: Secondary | ICD-10-CM

## 2020-08-01 MED ORDER — ESCITALOPRAM OXALATE 10 MG PO TABS: 10.0000 mg | ORAL_TABLET | Freq: Every day | ORAL | 1 refills | Status: DC

## 2020-08-01 NOTE — Progress Notes (Signed)
Change to 10mg  tablets as per office note 06/27/2020

## 2020-09-19 ENCOUNTER — Telehealth (INDEPENDENT_AMBULATORY_CARE_PROVIDER_SITE_OTHER): Payer: 59 | Admitting: Family Medicine

## 2020-09-19 ENCOUNTER — Encounter: Payer: Self-pay | Admitting: Family Medicine

## 2020-09-19 VITALS — Temp 97.2°F | Wt 120.0 lb

## 2020-09-19 DIAGNOSIS — F320 Major depressive disorder, single episode, mild: Secondary | ICD-10-CM | POA: Diagnosis not present

## 2020-09-19 NOTE — Progress Notes (Signed)
MyChart Video Visit    Virtual Visit via Video Note   This visit type was conducted due to national recommendations for restrictions regarding the COVID-19 Pandemic (e.g. social distancing) in an effort to limit this patient's exposure and mitigate transmission in our community. This patient is at least at moderate risk for complications without adequate follow up. This format is felt to be most appropriate for this patient at this time. Physical exam was limited by quality of the video and audio technology used for the visit.   Patient location: home Provider location: [facility]  I discussed the limitations of evaluation and management by telemedicine and the availability of in person appointments. The patient expressed understanding and agreed to proceed.  Patient: Eddie Olson   DOB: 1985-11-22   34 y.o. Male  MRN: [id number] Visit Date: 09/19/2020  Today's healthcare provider: Mila Merry, MD   Chief Complaint  Patient presents with  . Depression   Subjective    HPI  Depression, Follow-up  He  was last seen for this 3 months ago. Changes made at last visit include increasing escitalopram to 10 mg daily .   He reports excellent compliance with treatment. He is not having side effects.   He reports excellent tolerance of treatment. Current symptoms include: patient denies any symptoms He feels he is Improved since last visit.  Depression screen [facility] 2/9 09/19/2020 06/27/2020 05/30/2020  Decreased Interest 0 0 1  Down, Depressed, Hopeless 0 1 3  PHQ - 2 Score 0 1 4  Altered sleeping 0 0 0  Tired, decreased energy 0 1 1  Change in appetite 0 1 3  Feeling bad or failure about yourself  0 1 3  Trouble concentrating 0 0 3  Moving slowly or fidgety/restless 0 0 0  Suicidal thoughts 0 0 0  PHQ-9 Score 0 4 14  Difficult doing work/chores Not difficult at all Not difficult at all Very difficult     -----------------------------------------------------------------------------------------   Patient Active Problem List   Diagnosis Date Noted  . Current mild episode of major depressive disorder without prior episode (HCC) 06/27/2020  . GERD (gastroesophageal reflux disease) 04/23/2017  . Hiatal hernia 04/23/2017  . Early satiety 03/12/2017   Social History   Tobacco Use  . Smoking status: Former Smoker    Years: 5.00  . Smokeless tobacco: Never Used  Substance Use Topics  . Alcohol use: No  . Drug use: Yes    Types: Marijuana   Allergies  Allergen Reactions  . Bupropion     Nausea, worse       Medications: Outpatient Medications Prior to Visit  Medication Sig  . escitalopram (LEXAPRO) 10 MG tablet Take 1 tablet (10 mg total) by mouth daily.  Marland Kitchen omeprazole (PRILOSEC) 20 MG capsule Please specify directions, refills and quantity (Patient taking differently: Take 20 mg by mouth daily.)   No facility-administered medications prior to visit.    Review of Systems  Constitutional: Negative.   Respiratory: Negative.   Cardiovascular: Negative.   Psychiatric/Behavioral: Negative.     Last thyroid functions Lab Results  Component Value Date   TSH 1.350 04/25/2020   T4TOTAL 7.4 03/10/2017      Objective    Temp (!) 97.2 F (36.2 C) (Oral)   Wt 120 lb (54.4 kg)   BMI 20.60 kg/m  BP Readings from Last 3 Encounters:  06/27/20 103/66  05/30/20 108/68  04/25/20 104/73   Wt Readings from Last 3 Encounters:  09/19/20 120  lb (54.4 kg)  06/27/20 120 lb (54.4 kg)  05/30/20 120 lb (54.4 kg)      Physical Exam   Awake, alert, oriented x 3. In no apparent distress   Assessment & Plan     1. Current mild episode of major depressive disorder without prior episode (HCC) Doing very will since increasing escitalopram from 5 to 10mg  with no current adverse effects. Continue current medications.  Follow up 5-6 months.        I discussed the assessment and  treatment plan with the patient. The patient was provided an opportunity to ask questions and all were answered. The patient agreed with the plan and demonstrated an understanding of the instructions.   The patient was advised to call back or seek an in-person evaluation if the symptoms worsen or if the condition fails to improve as anticipated.  I provided 8 minutes of non-face-to-face time during this encounter.  The entirety of the information documented in the History of Present Illness, Review of Systems and Physical Exam were personally obtained by me. Portions of this information were initially documented by the CMA and reviewed by me for thoroughness and accuracy.     , MD [facility] [phone] (phone) [phone] (fax)  [facility]

## 2020-09-27 ENCOUNTER — Telehealth: Payer: Self-pay | Admitting: Family Medicine

## 2021-01-31 ENCOUNTER — Other Ambulatory Visit: Payer: Self-pay | Admitting: Family Medicine

## 2021-01-31 DIAGNOSIS — F320 Major depressive disorder, single episode, mild: Secondary | ICD-10-CM

## 2021-01-31 NOTE — Telephone Encounter (Signed)
Requested Prescriptions  Pending Prescriptions Disp Refills  . escitalopram (LEXAPRO) 10 MG tablet [Pharmacy Med Name: ESCITALOPRAM 10 MG TABLET] 90 tablet 0    Sig: TAKE 1 TABLET BY MOUTH EVERY DAY     Psychiatry:  Antidepressants - SSRI Passed - 01/31/2021  1:19 AM      Passed - Completed PHQ-2 or PHQ-9 in the last 360 days      Passed - Valid encounter within last 6 months    Recent Outpatient Visits          4 months ago Current mild episode of major depressive disorder without prior episode [facility])   [facility] Malva Limes, MD   7 months ago Current mild episode of major depressive disorder without prior episode [facility])   [facility] Malva Limes, MD   8 months ago Current mild episode of major depressive disorder without prior episode [facility])   [facility] Malva Limes, MD   9 months ago Annual physical exam   [facility] Malva Limes, MD   1 year ago Gastroesophageal reflux disease without esophagitis   [facility] Fisher, Demetrios Isaacs, MD      Future Appointments            In 3 weeks Fisher, Demetrios Isaacs, MD [facility]

## 2021-02-21 ENCOUNTER — Telehealth: Payer: Self-pay | Admitting: Family Medicine

## 2021-05-02 ENCOUNTER — Other Ambulatory Visit: Payer: Self-pay | Admitting: Family Medicine

## 2021-05-02 DIAGNOSIS — F320 Major depressive disorder, single episode, mild: Secondary | ICD-10-CM

## 2021-05-02 NOTE — Telephone Encounter (Signed)
Courtesy refill. patient needs an office visit for further refills. Requested Prescriptions  Pending Prescriptions Disp Refills  . escitalopram (LEXAPRO) 10 MG tablet [Pharmacy Med Name: ESCITALOPRAM 10 MG TABLET] 30 tablet 0    Sig: TAKE 1 TABLET BY MOUTH EVERY DAY     Psychiatry:  Antidepressants - SSRI Failed - 05/02/2021  3:07 AM      Failed - Valid encounter within last 6 months    Recent Outpatient Visits          7 months ago Current mild episode of major depressive disorder without prior episode [facility])   [facility] Malva Limes, MD   10 months ago Current mild episode of major depressive disorder without prior episode [facility])   [facility] Malva Limes, MD   11 months ago Current mild episode of major depressive disorder without prior episode [facility])   [facility] Malva Limes, MD   1 year ago Annual physical exam   [facility] Malva Limes, MD   1 year ago Gastroesophageal reflux disease without esophagitis   [facility] Malva Limes, MD             Passed - Completed PHQ-2 or PHQ-9 in the last 360 days

## 2021-06-04 ENCOUNTER — Telehealth (INDEPENDENT_AMBULATORY_CARE_PROVIDER_SITE_OTHER): Payer: Self-pay | Admitting: Family Medicine

## 2021-06-04 DIAGNOSIS — F320 Major depressive disorder, single episode, mild: Secondary | ICD-10-CM

## 2021-06-04 MED ORDER — ESCITALOPRAM OXALATE 10 MG PO TABS: 10.0000 mg | ORAL_TABLET | Freq: Every day | ORAL | 4 refills | Status: DC

## 2021-06-04 NOTE — Progress Notes (Signed)
MyChart Video Visit    Virtual Visit via Video Note   This visit type was conducted due to national recommendations for restrictions regarding the COVID-19 Pandemic (e.g. social distancing) in an effort to limit this patient's exposure and mitigate transmission in our community. This patient is at least at moderate risk for complications without adequate follow up. This format is felt to be most appropriate for this patient at this time. Physical exam was limited by quality of the video and audio technology used for the visit.   Patient location: home Provider location: [facility]  I discussed the limitations of evaluation and management by telemedicine and the availability of in person appointments. The patient expressed understanding and agreed to proceed.  Patient: Eddie Olson   DOB: 1985/10/18   35 y.o. Male  MRN: [id number] Visit Date: 06/04/2021  Today's healthcare provider: Mila Merry, MD   Chief Complaint  Patient presents with   Depression   Subjective    HPI  Depression, Follow-up  He  was last seen for this on 09/19/2020 via virtual visit.  Changes made at last visit include none. Patient was advised to continue current medications and follow up 5-6 months.     He reports good compliance with treatment. He is not having side effects.   He reports good tolerance of treatment. Current symptoms include: depressed mood He feels he is Improved since last visit.  Depression screen [facility] 2/9 06/04/2021 09/19/2020 06/27/2020  Decreased Interest 0 0 0  Down, Depressed, Hopeless 1 0 1  PHQ - 2 Score 1 0 1  Altered sleeping 0 0 0  Tired, decreased energy 0 0 1  Change in appetite 0 0 1  Feeling bad or failure about yourself  1 0 1  Trouble concentrating 0 0 0  Moving slowly or fidgety/restless 0 0 0  Suicidal thoughts 0 0 0  PHQ-9 Score 2 0 4  Difficult doing work/chores Not difficult at all Not difficult at all Not difficult at all     -----------------------------------------------------------------------------------------     Medications: Outpatient Medications Prior to Visit  Medication Sig   escitalopram (LEXAPRO) 10 MG tablet TAKE 1 TABLET BY MOUTH EVERY DAY   omeprazole (PRILOSEC OTC) 20 MG tablet Take 20 mg by mouth daily.   omeprazole (PRILOSEC) 20 MG capsule Please specify directions, refills and quantity (Patient not taking: Reported on 06/04/2021)   No facility-administered medications prior to visit.    Review of Systems  Constitutional:  Negative for appetite change, chills and fever.  Respiratory:  Negative for chest tightness, shortness of breath and wheezing.   Cardiovascular:  Negative for chest pain and palpitations.  Gastrointestinal:  Negative for abdominal pain, nausea and vomiting.     Objective       Physical Exam   Awake, alert, oriented x 3. In no apparent distress   Assessment & Plan     1. Current mild episode of major depressive disorder without prior episode [facility]) He is doing very well on current medications. Discussed continuing versus weaning or stopping medication. Advised not to stop medication without notifying me to set up taper. He wishes to continue current medications for the time being.  - escitalopram (LEXAPRO) 10 MG tablet; Take 1 tablet (10 mg total) by mouth daily.  Dispense: 90 tablet; Refill: 4  Is to schedule follow up August 2023.     I discussed the assessment and treatment plan with the patient. The patient was provided an opportunity to ask  questions and all were answered. The patient agreed with the plan and demonstrated an understanding of the instructions.   The patient was advised to call back or seek an in-person evaluation if the symptoms worsen or if the condition fails to improve as anticipated.  I provided 6 minutes of non-face-to-face time during this encounter.  The entirety of the information documented in the History of Present Illness,  Review of Systems and Physical Exam were personally obtained by me. Portions of this information were initially documented by the CMA and reviewed by me for thoroughness and accuracy.    Mila Merry, MD [facility] [phone] (phone) [phone] (fax)  [facility]

## 2021-08-24 ENCOUNTER — Other Ambulatory Visit: Payer: Self-pay | Admitting: Family Medicine

## 2021-08-24 DIAGNOSIS — F320 Major depressive disorder, single episode, mild: Secondary | ICD-10-CM

## 2021-08-24 MED ORDER — ESCITALOPRAM OXALATE 10 MG PO TABS
10.0000 mg | ORAL_TABLET | Freq: Every day | ORAL | 3 refills | Status: DC
Start: 2021-08-24 — End: 2022-07-14

## 2021-08-24 NOTE — Telephone Encounter (Signed)
Requested Prescriptions  Pending Prescriptions Disp Refills  . escitalopram (LEXAPRO) 10 MG tablet 90 tablet 3    Sig: Take 1 tablet (10 mg total) by mouth daily.     Psychiatry:  Antidepressants - SSRI Passed - 08/24/2021  3:40 PM      Passed - Completed PHQ-2 or PHQ-9 in the last 360 days      Passed - Valid encounter within last 6 months    Recent Outpatient Visits          2 months ago Current mild episode of major depressive disorder without prior episode [facility])   [facility] Malva Limes, MD   11 months ago Current mild episode of major depressive disorder without prior episode [facility])   [facility] Malva Limes, MD   1 year ago Current mild episode of major depressive disorder without prior episode [facility])   [facility], [facility]   1 year ago Current mild episode of major depressive disorder without prior episode [facility])   [facility] Malva Limes, MD   1 year ago Annual physical exam   [facility] Malva Limes, MD             . omeprazole (PRILOSEC OTC) 20 MG tablet      Sig: Take 1 tablet (20 mg total) by mouth daily.     Gastroenterology: Proton Pump Inhibitors Passed - 08/24/2021  3:40 PM      Passed - Valid encounter within last 12 months    Recent Outpatient Visits          2 months ago Current mild episode of major depressive disorder without prior episode [facility]   [facility] Malva Limes, MD   11 months ago Current mild episode of major depressive disorder without prior episode [facility])   [facility] Malva Limes, MD   1 year ago Current mild episode of major depressive disorder without prior episode [facility])   [facility]va Limes, MD   1 year ago Current mild episode of major depressive disorder without prior episode [facility])   [facility] Malva Limes, MD   1 year ago Annual physical exam    [facility] Malva Limes, MD

## 2021-08-24 NOTE — Telephone Encounter (Signed)
Requested medications are on the active medication list yes  Last visit 06/04/21  Future visit scheduled no  Notes to clinic Historical Provider, please assess. Requested Prescriptions  Pending Prescriptions Disp Refills   omeprazole (PRILOSEC OTC) 20 MG tablet      Sig: Take 1 tablet (20 mg total) by mouth daily.     Gastroenterology: Proton Pump Inhibitors Passed - 08/24/2021  3:40 PM      Passed - Valid encounter within last 12 months    Recent Outpatient Visits           2 months ago Current mild episode of major depressive disorder without prior episode [facility])   [facility] Malva Limes, MD   11 months ago Current mild episode of major depressive disorder without prior episode [facility])   [facility] Malva Limes, MD   1 year ago Current mild episode of major depressive disorder without prior episode [facility])   [facility] Malva Limes, MD   1 year ago Current mild episode of major depressive disorder without prior episode [facility])   [facility] Malva Limes, MD   1 year ago Annual physical exam   [facility] Malva Limes, MD              Signed Prescriptions Disp Refills   escitalopram (LEXAPRO) 10 MG tablet 90 tablet 3    Sig: Take 1 tablet (10 mg total) by mouth daily.     Psychiatry:  Antidepressants - SSRI Passed - 08/24/2021  3:40 PM      Passed - Completed PHQ-2 or PHQ-9 in the last 360 days      Passed - Valid encounter within last 6 months    Recent Outpatient Visits           2 months ago Current mild episode of major depressive disorder without prior episode [facility])   [facility] Malva Limes, MD   11 months ago Current mild episode of major depressive disorder without prior episode [facility])   [facility] Malva Limes, MD   1 year ago Current mild episode of major depressive disorder without prior episode [facility])   [facility] Malva Limes, MD   1 year ago Current mild episode of major depressive disorder without prior episode [facility])   [facility] Malva Limes, MD   1 year ago Annual physical exam   [facility] Malva Limes, MD

## 2021-08-24 NOTE — Telephone Encounter (Signed)
Medication Refill - Medication:escitalopram (LEXAPRO) 10 MG tablet omeprazole and (PRILOSEC OTC) 20 MG tablet  Has the patient contacted their pharmacy? yes (Agent: If no, request that the patient contact the pharmacy for the refill. If patient does not wish to contact the pharmacy document the reason why and proceed with request.) (Agent: If yes, when and what did the pharmacy advise?)contact pcp/patient is switching pharmacies because of cost  Preferred Pharmacy (with phone number or street name): [facility] - [place], [place] - [address] [place]  Phone:  [phone] Fax:  [phone] Has the patient been seen for an appointment in the last year OR does the patient have an upcoming appointment? yes  Agent: Please be advised that RX refills may take up to 3 business days. We ask that you follow-up with your pharmacy.

## 2021-08-25 MED ORDER — OMEPRAZOLE MAGNESIUM 20 MG PO TBEC: 20.0000 mg | DELAYED_RELEASE_TABLET | Freq: Every day | ORAL | 4 refills | Status: DC

## 2022-07-14 ENCOUNTER — Other Ambulatory Visit: Payer: Self-pay | Admitting: Family Medicine

## 2022-07-14 DIAGNOSIS — F320 Major depressive disorder, single episode, mild: Secondary | ICD-10-CM

## 2022-07-15 ENCOUNTER — Other Ambulatory Visit: Payer: Self-pay | Admitting: Family Medicine

## 2022-07-15 MED ORDER — ESCITALOPRAM OXALATE 10 MG PO TABS: 10.0000 mg | ORAL_TABLET | Freq: Every day | ORAL | 0 refills | Status: DC

## 2022-07-15 NOTE — Telephone Encounter (Signed)
Pt called in to request a refill for his omeprazole (PRILOSEC OTC) 20 MG tablet. Pt says that he has reached out to his pharmacy. Pt was told that request hasn't been responded to. (Not showing in chart)    Pharmacy:  [id number] - [place], [place] Phone:  [phone]  Fax:  [phone]       Last ov: 06/04/22  No future appt scheduled.

## 2022-07-15 NOTE — Addendum Note (Signed)
Addended by: Birdie Sons on: 07/15/2022 07:50 AM   Modules accepted: Orders

## 2022-07-16 MED ORDER — OMEPRAZOLE MAGNESIUM 20 MG PO TBEC: 20.0000 mg | DELAYED_RELEASE_TABLET | Freq: Every day | ORAL | 0 refills | Status: DC

## 2022-07-16 NOTE — Telephone Encounter (Signed)
Requested medication (s) are due for refill today: yes  Requested medication (s) are on the active medication list:yes  Last refill:  08/25/21  Future visit scheduled:no  Notes to clinic:  Unable to refill per protocol, appointment needed.      Requested Prescriptions  Pending Prescriptions Disp Refills   omeprazole (PRILOSEC OTC) 20 MG tablet 90 tablet 4    Sig: Take 1 tablet (20 mg total) by mouth daily.     Gastroenterology: Proton Pump Inhibitors Failed - 07/15/2022  1:05 PM      Failed - Valid encounter within last 12 months    Recent Outpatient Visits           1 year ago Current mild episode of major depressive disorder without prior episode [facility])   [facility] Birdie Sons, MD   1 year ago Current mild episode of major depressive disorder without prior episode [facility])   [facility] Birdie Sons, MD   2 years ago Current mild episode of major depressive disorder without prior episode [facility])   [facility] Birdie Sons, MD   2 years ago Current mild episode of major depressive disorder without prior episode [facility])   [facility] Birdie Sons, MD   2 years ago Annual physical exam   [facility] Birdie Sons, MD

## 2022-08-08 ENCOUNTER — Other Ambulatory Visit: Payer: Self-pay | Admitting: Family Medicine

## 2022-08-08 NOTE — Telephone Encounter (Signed)
Requested medication (s) are due for refill today:   Yes  it was a 30 day courtesy supply  Requested medication (s) are on the active medication list:   Yes  Future visit scheduled:   Yes tomorrow morning at 8:00 with Dr. Caryn Section (11/3)   Last ordered: 07/16/2022 #30, 0 refills  Returned because needs OV for refills.  Appt. In the morning.     Requested Prescriptions  Pending Prescriptions Disp Refills   omeprazole (PRILOSEC) 20 MG capsule [Pharmacy Med Name: OMEPRAZOLE DR 20 MG CAPSULE] 90 capsule 1    Sig: TAKE 1 CAPSULE BY MOUTH EVERY DAY     Gastroenterology: Proton Pump Inhibitors Failed - 08/08/2022 12:33 PM      Failed - Valid encounter within last 12 months    Recent Outpatient Visits           1 year ago Current mild episode of major depressive disorder without prior episode [facility])   [facility] Birdie Sons, MD   1 year ago Current mild episode of major depressive disorder without prior episode [facility])   [facility] Birdie Sons, MD   2 years ago Current mild episode of major depressive disorder without prior episode [facility])   [facility] Birdie Sons, MD   2 years ago Current mild episode of major depressive disorder without prior episode [facility])   [place], Kirstie Peri, MD   2 years ago Annual physical exam   [place], Kirstie Peri, MD       Future Appointments             Tomorrow Caryn Section, Kirstie Peri, MD [facility] [place]

## 2022-08-08 NOTE — Progress Notes (Signed)
I,Roshena L Chambers,acting as a scribe for Lelon Huh, MD.,have documented all relevant documentation on the behalf of Lelon Huh, MD,as directed by  Lelon Huh, MD while in the presence of Lelon Huh, MD.    Established patient visit   Patient: Eddie Olson   DOB: 12/08/1985   36 y.o. Male  MRN: [id number] Visit Date: 08/09/2022  Today's healthcare provider: Lelon Huh, MD   Chief Complaint  Patient presents with   Depression   Gastroesophageal Reflux   Subjective    HPI  Depression, Follow-up  He  was last seen for this on 06/04/2022 via virtual visit During that visit, discussed continuing versus weaning or stopping medication which he decided to continue.    He reports good compliance with treatment. He is having side effects. He states that Lexapro makes him feel sleepy all day. He even tried taking it at night, but just felt extra tired in the morning.   He reports good tolerance of treatment. Current symptoms include: depressed mood He feels he is Unchanged since last visit.     08/09/2022    8:16 AM 06/04/2021    9:13 AM 09/19/2020    8:07 AM  Depression screen PHQ 2/9  Decreased Interest 0 0 0  Down, Depressed, Hopeless 2 1 0  PHQ - 2 Score 2 1 0  Altered sleeping 0 0 0  Tired, decreased energy 3 0 0  Change in appetite 0 0 0  Feeling bad or failure about yourself  0 1 0  Trouble concentrating 0 0 0  Moving slowly or fidgety/restless 0 0 0  Suicidal thoughts 0 0 0  PHQ-9 Score 5 2 0  Difficult doing work/chores Somewhat difficult Not difficult at all Not difficult at all    -----------------------------------------------------------------------------------------   Follow up for GERD:  The patient was last seen for this more than 1 year ago.  Current treatment includes Omeprazole. He usually takes it every other day.   He reports good compliance with treatment. He feels that condition is Unchanged. He is not having side effects.    -----------------------------------------------------------------------------------------   Medications: Outpatient Medications Prior to Visit  Medication Sig   escitalopram (LEXAPRO) 10 MG tablet Take 1 tablet (10 mg total) by mouth daily.   omeprazole (PRILOSEC OTC) 20 MG tablet Take 1 tablet (20 mg total) by mouth daily.   No facility-administered medications prior to visit.    Review of Systems  Constitutional:  Negative for appetite change, chills and fever.  Respiratory:  Negative for chest tightness, shortness of breath and wheezing.   Cardiovascular:  Negative for chest pain and palpitations.  Gastrointestinal:  Negative for abdominal pain, nausea and vomiting.       Objective    BP 110/73 (BP Location: Left Arm, Patient Position: Sitting, Cuff Size: Normal)   Pulse 60   Temp 98.3 F (36.8 C) (Oral)   Resp 14   Ht 5' 4.02" (1.626 m)   Wt 142 lb 9.6 oz (64.7 kg)   SpO2 100% Comment: room air  BMI 24.47 kg/m    Physical Exam   General: Appearance:    Well developed, well nourished male in no acute distress  Eyes:    PERRL, conjunctiva/corneas clear, EOM's intact       Lungs:     Clear to auscultation bilaterally, respirations unlabored  Heart:    Normal heart rate. Normal rhythm. No murmurs, rubs, or gallops.    MS:   All extremities are  intact.    Neurologic:   Awake, alert, oriented x 3. No apparent focal neurological defect.        Assessment & Plan     1. Current mild episode of major depressive disorder without prior episode (HCC) Well controlled since taking escitalopram, but he feels this is making him sleepy throughout the day. He did take sertraline several years ago which he states worked well for him and does not recall it making sleepy. Will try changing to sertraline 50mg . Advised to call if any problems or side effects or if less effective than escitalopram. Advised we could increase to 100mg  if any worsening of depression symptoms and if  tolerating well  2. Other fatigue  - CBC - TSH  3. Gastroesophageal reflux disease, unspecified whether esophagitis present Well controlled with dietary modifications and OTC omeprazole which he takes 3-4 times a week.  - Comprehensive metabolic panel - Magnesium  4. Encounter for special screening examination for cardiovascular disorder  - Lipid panel  5. Need for immunization against influenza  - Flu Vaccine QUAD 37mo+IM (Fluarix, Fluzone & Alfiuria Quad PF)      The entirety of the information documented in the History of Present Illness, Review of Systems and Physical Exam were personally obtained by me. Portions of this information were initially documented by the CMA and reviewed by me for thoroughness and accuracy.     , MD  [facility] [phone] (phone) [phone] (fax)  [facility]

## 2022-08-09 ENCOUNTER — Encounter: Payer: Self-pay | Admitting: Family Medicine

## 2022-08-09 ENCOUNTER — Ambulatory Visit (INDEPENDENT_AMBULATORY_CARE_PROVIDER_SITE_OTHER): Payer: No Typology Code available for payment source | Admitting: Family Medicine

## 2022-08-09 VITALS — BP 110/73 | HR 60 | Temp 98.3°F | Resp 14 | Ht 64.02 in | Wt 142.6 lb

## 2022-08-09 DIAGNOSIS — F320 Major depressive disorder, single episode, mild: Secondary | ICD-10-CM | POA: Diagnosis not present

## 2022-08-09 DIAGNOSIS — Z23 Encounter for immunization: Secondary | ICD-10-CM | POA: Diagnosis not present

## 2022-08-09 DIAGNOSIS — K219 Gastro-esophageal reflux disease without esophagitis: Secondary | ICD-10-CM

## 2022-08-09 DIAGNOSIS — R5383 Other fatigue: Secondary | ICD-10-CM

## 2022-08-09 DIAGNOSIS — Z136 Encounter for screening for cardiovascular disorders: Secondary | ICD-10-CM

## 2022-08-09 MED ORDER — SERTRALINE HCL 50 MG PO TABS: 50.0000 mg | ORAL_TABLET | Freq: Every day | ORAL | 2 refills | Status: DC

## 2022-08-10 LAB — COMPREHENSIVE METABOLIC PANEL
ALT: 14 IU/L (ref 0–44)
AST: 14 IU/L (ref 0–40)
Albumin/Globulin Ratio: 2 (ref 1.2–2.2)
Albumin: 4.5 g/dL (ref 4.1–5.1)
Alkaline Phosphatase: 86 IU/L (ref 44–121)
BUN/Creatinine Ratio: 9 (ref 9–20)
BUN: 10 mg/dL (ref 6–20)
Bilirubin Total: 0.7 mg/dL (ref 0.0–1.2)
CO2: 25 mmol/L (ref 20–29)
Calcium: 9.4 mg/dL (ref 8.7–10.2)
Chloride: 103 mmol/L (ref 96–106)
Creatinine, Ser: 1.17 mg/dL (ref 0.76–1.27)
Globulin, Total: 2.2 g/dL (ref 1.5–4.5)
Glucose: 96 mg/dL (ref 70–99)
Potassium: 4.7 mmol/L (ref 3.5–5.2)
Sodium: 139 mmol/L (ref 134–144)
Total Protein: 6.7 g/dL (ref 6.0–8.5)
eGFR: 84 mL/min/{1.73_m2} (ref >59)

## 2022-08-10 LAB — LIPID PANEL
Chol/HDL Ratio: 3.6 ratio (ref 0.0–5.0)
Cholesterol, Total: 165 mg/dL (ref 100–199)
HDL: 46 mg/dL (ref >39)
LDL Chol Calc (NIH): 103 mg/dL — ABNORMAL HIGH (ref 0–99)
Triglycerides: 84 mg/dL (ref 0–149)
VLDL Cholesterol Cal: 16 mg/dL (ref 5–40)

## 2022-08-10 LAB — CBC
Hematocrit: 45.9 % (ref 37.5–51.0)
Hemoglobin: 16 g/dL (ref 13.0–17.7)
MCH: 31.5 pg (ref 26.6–33.0)
MCHC: 34.9 g/dL (ref 31.5–35.7)
MCV: 90 fL (ref 79–97)
Platelets: 190 10*3/uL (ref 150–450)
RBC: 5.08 x10E6/uL (ref 4.14–5.80)
RDW: 12 % (ref 11.6–15.4)
WBC: 5.2 10*3/uL (ref 3.4–10.8)

## 2022-08-10 LAB — TSH: TSH: 2.09 u[IU]/mL (ref 0.450–4.500)

## 2022-08-10 LAB — MAGNESIUM: Magnesium: 1.8 mg/dL (ref 1.6–2.3)

## 2022-08-31 ENCOUNTER — Other Ambulatory Visit: Payer: Self-pay | Admitting: Family Medicine

## 2022-09-05 ENCOUNTER — Other Ambulatory Visit: Payer: Self-pay | Admitting: Family Medicine

## 2022-09-06 MED ORDER — OMEPRAZOLE MAGNESIUM 20 MG PO TBEC: 20.0000 mg | DELAYED_RELEASE_TABLET | Freq: Every day | ORAL | 3 refills | Status: AC

## 2023-08-14 ENCOUNTER — Encounter: Payer: Self-pay | Admitting: Family Medicine

## 2023-08-18 ENCOUNTER — Encounter: Payer: Self-pay | Admitting: Family Medicine

## 2023-08-18 ENCOUNTER — Ambulatory Visit (INDEPENDENT_AMBULATORY_CARE_PROVIDER_SITE_OTHER): Payer: No Typology Code available for payment source | Admitting: Family Medicine

## 2023-08-18 ENCOUNTER — Ambulatory Visit: Payer: Self-pay

## 2023-08-18 VITALS — BP 124/88 | HR 68 | Temp 97.3°F | Resp 16 | Ht 64.02 in | Wt 129.3 lb

## 2023-08-18 DIAGNOSIS — H8111 Benign paroxysmal vertigo, right ear: Secondary | ICD-10-CM | POA: Insufficient documentation

## 2023-08-18 MED ORDER — MECLIZINE HCL 25 MG PO TABS: 25.0000 mg | ORAL_TABLET | Freq: Three times a day (TID) | ORAL | 0 refills | Status: AC | PRN

## 2023-08-18 NOTE — Progress Notes (Signed)
Established patient visit   Patient: Eddie Olson   DOB: 03/15/1986   36 y.o. Male  MRN: [id number] Visit Date: 08/18/2023  Today's healthcare provider: Ronnald Ramp, MD   Chief Complaint  Patient presents with   Dizziness    Started about one week ago   Nausea        Subjective     HPI     Dizziness    Additional comments: Started about one week ago        Nausea    Additional comments:        Last edited by Ashok Cordia, CMA on 08/18/2023  2:24 PM.       Discussed the use of AI scribe software for clinical note transcription with the patient, who gave verbal consent to proceed.  History of Present Illness   The patient, with no significant past medical history, presents with a week-long history of dizziness and neck stiffness. The dizziness is described as a subtle movement or fuzziness in the visual field, more pronounced with sudden movements or changes in position, such as going from lying down to standing up. The patient denies any associated headaches, but reports a consistent pressure sensation at the back of the head, now more localized to the left side. The patient also reports neck stiffness, with a sensation of resistance when turning the neck to the left. Bright lights reportedly exacerbate the dizziness, causing the room to appear brighter and then return to normal. The patient denies any associated nausea. The symptoms have been gradually decreasing in intensity over the past week. The patient also reports a recent eye exam within the year, with no changes in vision or need for corrective lenses.         Past Medical History:  Diagnosis Date   Anxiety    Heartburn    Scoliosis     Medications: Outpatient Medications Prior to Visit  Medication Sig   omeprazole (PRILOSEC OTC) 20 MG tablet Take 1 tablet (20 mg total) by mouth daily.   sertraline (ZOLOFT) 50 MG tablet TAKE 1 TABLET BY MOUTH EVERY DAY   No  facility-administered medications prior to visit.    Review of Systems      Objective    BP 124/88   Pulse 68   Temp (!) 97.3 F (36.3 C)   Resp 16   Ht 5' 4.02" (1.626 m)   Wt 129 lb 4.8 oz (58.7 kg)   SpO2 98%   BMI 22.18 kg/m  BP Readings from Last 3 Encounters:  08/18/23 124/88  08/09/22 110/73  06/27/20 103/66   Wt Readings from Last 3 Encounters:  08/18/23 129 lb 4.8 oz (58.7 kg)  08/09/22 142 lb 9.6 oz (64.7 kg)  09/19/20 120 lb (54.4 kg)       Physical Exam  Physical Exam   VITALS: BP- 120/90 NEUROLOGICAL: Induced slight dizziness with positional changes.     General: Alert, no acute distress Cardio: Normal S1 and S2, RRR, no r/m/g Pulm: CTAB, normal work of breathing Abdomen: Bowel sounds normal. Abdomen soft and non-tender.  Extremities: No peripheral edema.     No results found for any visits on 08/18/23.  Assessment & Plan     Problem List Items Addressed This Visit       Nervous and Auditory   Benign paroxysmal positional vertigo of right ear - Primary    Vertigo with dizziness, new problem, subtle room movement, and left-sided head  pressure, exacerbated by sudden movements and bright lights. 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 - Provide instructions for the Epley maneuver. - Prescribe meclizine, 25 mg, three times daily as needed for dizziness. - Advise to monitor symptoms and return if symptoms persist beyond two weeks or worsen. - Discuss potential causes including crystal displacement in the inner ear and lifestyle factors. - Seek further evaluation if experiencing severe imbalance, difficulty ambulating, shortness of breath, or shooting pains.      Relevant Medications   meclizine (ANTIVERT) 25 MG tablet     Return in about 2 weeks (around 09/01/2023), or if symptoms worsen or fail to improve, for Dizziness.         Ronnald Ramp, MD  [facility] [phone] (phone) [phone] (fax)  [facility]

## 2023-08-18 NOTE — Telephone Encounter (Signed)
  Chief Complaint: dizziness Symptoms: dizziness when moving head, pressure on back side of head more of Left lower side Frequency: last Tuesday  Pertinent Negatives: Patient denies vision changes or numbness in extremities Disposition: [] ED /[] Urgent Care (no appt availability in office) / [x] Appointment(In office/virtual)/ []  [place] [facility]/ [] Home Care/ [] Refused Recommended Disposition /[] [facility]/ []  Follow-up with PCP Additional Notes: pt states dizziness started last Tuesday with pressure on back side of lower head. Took some Tylenol and did help slightly. Dizziness has gotten slightly better but still present when moving head with slight movement. Scheduled OV today at 1420 with Dr. Roxan Hockey.   Reason for Disposition  [1] MILD dizziness (e.g., walking normally) AND [2] has NOT been evaluated by doctor (or NP/PA) for this  (Exception: Dizziness caused by heat exposure, sudden standing, or poor fluid intake.)  Answer Assessment - Initial Assessment Questions 1. DESCRIPTION: "Describe your dizziness."     Swimmy headed  2. LIGHTHEADED: "Do you feel lightheaded?" (e.g., somewhat faint, woozy, weak upon standing)     Woozy feeling  3. VERTIGO: "Do you feel like either you or the room is spinning or tilting?" (i.e. vertigo)     Slightly  4. SEVERITY: "How bad is it?"  "Do you feel like you are going to faint?" "Can you stand and walk?"   - MILD: Feels slightly dizzy, but walking normally.   - MODERATE: Feels unsteady when walking, but not falling; interferes with normal activities (e.g., school, work).   - SEVERE: Unable to walk without falling, or requires assistance to walk without falling; feels like passing out now.      Mild  5. ONSET:  "When did the dizziness begin?"     Tuesday  6. AGGRAVATING FACTORS: "Does anything make it worse?" (e.g., standing, change in head position)     Moving head  10. OTHER SYMPTOMS: "Do you have any other symptoms?" (e.g., fever,  chest pain, vomiting, diarrhea, bleeding)       Pressure on back of head, lower middle of head now on Left side  Protocols used: Dizziness - Lightheadedness-A-AH

## 2023-08-18 NOTE — Patient Instructions (Signed)
How to Perform the Epley Maneuver The Epley maneuver is an exercise that relieves symptoms of vertigo. Vertigo is the feeling that you or your surroundings are moving when they are not. When you feel vertigo, you may feel like the room is spinning and may have trouble walking. The Epley maneuver is used for a type of vertigo caused by a calcium deposit in a part of the inner ear. The maneuver involves changing head positions to help the deposit move out of the area. You can do this maneuver at home whenever you have symptoms of vertigo. You can repeat it in 24 hours if your vertigo has not gone away. Even though the Epley maneuver may relieve your vertigo for a few weeks, it is possible that your symptoms will return. This maneuver relieves vertigo, but it does not relieve dizziness. What are the risks? If it is done correctly, the Epley maneuver is considered safe. Sometimes it can lead to dizziness or nausea that goes away after a short time. If you develop other symptoms--such as changes in vision, weakness, or numbness--stop doing the maneuver and call your health care provider. Supplies needed: A bed or table. A pillow. How to do the Epley maneuver     Sit on the edge of a bed or table with your back straight and your legs extended or hanging over the edge of the bed or table. Turn your head halfway toward the affected ear or side as told by your health care provider. Lie backward quickly with your head turned until you are lying flat on your back. Your head should dangle (head-hanging position). You may want to position a pillow under your shoulders. Hold this position for at least 30 seconds. If you feel dizzy or have symptoms of vertigo, continue to hold the position until the symptoms stop. Turn your head to the opposite direction until your unaffected ear is facing down. Your head should continue to dangle. Hold this position for at least 30 seconds. If you feel dizzy or have symptoms of  vertigo, continue to hold the position until the symptoms stop. Turn your whole body to the same side as your head so that you are positioned on your side. Your head will now be nearly facedown and no longer needs to dangle. Hold for at least 30 seconds. If you feel dizzy or have symptoms of vertigo, continue to hold the position until the symptoms stop. Sit back up. You can repeat the maneuver in 24 hours if your vertigo does not go away. Follow these instructions at home: For 24 hours after doing the Epley maneuver: Keep your head in an upright position. When lying down to sleep or rest, keep your head raised (elevated) with two or more pillows. Avoid excessive neck movements. Activity Do not drive or use machinery if you feel dizzy. After doing the Epley maneuver, return to your normal activities as told by your health care provider. Ask your health care provider what activities are safe for you. General instructions Drink enough fluid to keep your urine pale yellow. Do not drink alcohol. Take over-the-counter and prescription medicines only as told by your health care provider. Keep all follow-up visits. This is important. Preventing vertigo symptoms Ask your health care provider if there is anything you should do at home to prevent vertigo. He or she may recommend that you: Keep your head elevated with two or more pillows while you sleep. Do not sleep on the side of your affected ear. Get   up slowly from bed. Avoid sudden movements during the day. Avoid extreme head positions or movement, such as looking up or bending over. Contact a health care provider if: Your vertigo gets worse. You have other symptoms, including: Nausea. Vomiting. Headache. Get help right away if you: Have vision changes. Have a headache or neck pain that is severe or getting worse. Cannot stop vomiting. Have new numbness or weakness in any part of your body. These symptoms may represent a serious problem  that is an emergency. Do not wait to see if the symptoms will go away. Get medical help right away. Call your local emergency services (911 in the [place] Do not drive yourself to the hospital. Summary Vertigo is the feeling that you or your surroundings are moving when they are not. The Epley maneuver is an exercise that relieves symptoms of vertigo. If the Epley maneuver is done correctly, it is considered safe. This information is not intended to replace advice given to you by your health care provider. Make sure you discuss any questions you have with your health care provider. Document Revised: 08/23/2020 Document Reviewed: 08/23/2020 Elsevier Patient Education  2024 [employer]  

## 2023-08-18 NOTE — Assessment & Plan Note (Signed)
Vertigo with dizziness, new problem, subtle room movement, and left-sided head pressure, exacerbated by sudden movements and bright lights. 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 - Provide instructions for the Epley maneuver. - Prescribe meclizine, 25 mg, three times daily as needed for dizziness. - Advise to monitor symptoms and return if symptoms persist beyond two weeks or worsen. - Discuss potential causes including crystal displacement in the inner ear and lifestyle factors. - Seek further evaluation if experiencing severe imbalance, difficulty ambulating, shortness of breath, or shooting pains.

## 2023-09-14 ENCOUNTER — Other Ambulatory Visit: Payer: Self-pay | Admitting: Family Medicine

## 2023-09-15 ENCOUNTER — Telehealth: Payer: Self-pay | Admitting: Family Medicine

## 2023-09-15 NOTE — Telephone Encounter (Signed)
Medication Refill -  Most Recent Primary Care Visit:  Provider: Ronnald Ramp  Department: [facility]  Visit Type: OFFICE VISIT  Date: 08/18/2023  Medication: sertraline (ZOLOFT) 50 MG tablet   Has the patient contacted their pharmacy? Yes   Is this the correct pharmacy for this prescription? Yes If no, delete pharmacy and type the correct one.  This is the patient's preferred pharmacy:   [id number] - [place], [place] - [address] Phone: [phone]  Fax: [phone]      Has the prescription been filled recently? No  Is the patient out of the medication? No he has enough to get him through tomorrow  Has the patient been seen for an appointment in the last year OR does the patient have an upcoming appointment? Yes  Can we respond through MyChart? Yes  Please assist patient further

## 2023-09-15 NOTE — Telephone Encounter (Signed)
Requested medication (s) are due for refill today: Yes  Requested medication (s) are on the active medication list: Yes  Last refill:  09/01/22  Future visit scheduled: No  Notes to clinic:  Unable to refill per protocol due to failed labs, no updated results.      Requested Prescriptions  Pending Prescriptions Disp Refills   sertraline (ZOLOFT) 50 MG tablet [Pharmacy Med Name: SERTRALINE HCL 50 MG TABLET] 90 tablet 4    Sig: TAKE 1 TABLET BY MOUTH EVERY DAY     Psychiatry:  Antidepressants - SSRI - sertraline Failed - 09/14/2023  2:02 PM      Failed - AST in normal range and within 360 days    AST  Date Value Ref Range Status  08/09/2022 14 0 - 40 IU/L Final         Failed - ALT in normal range and within 360 days    ALT  Date Value Ref Range Status  08/09/2022 14 0 - 44 IU/L Final         Failed - Completed PHQ-2 or PHQ-9 in the last 360 days      Passed - Valid encounter within last 6 months    Recent Outpatient Visits           4 weeks ago Benign paroxysmal positional vertigo of right ear   [place] [facility] Simmons-Robinson, [place], MD   1 year ago Current mild episode of major depressive disorder without prior episode [facility])   [facility] Malva Limes, MD   2 years ago Current mild episode of major depressive disorder without prior episode [facility])   [place] [facility] Malva Limes, MD   2 years ago Current mild episode of major depressive disorder without prior episode [facility])   [place] [facility] Malva Limes, MD   3 years ago Current mild episode of major depressive disorder without prior episode [facility])   [place] [facility] Sherrie Mustache, Demetrios Isaacs, MD

## 2023-09-15 NOTE — Telephone Encounter (Signed)
Already requested by the pharmacy on 09/14/23, routed to the office for the provider approval.

## 2023-12-12 ENCOUNTER — Other Ambulatory Visit: Payer: Self-pay | Admitting: Family Medicine
